# Patient Record
Sex: Female | Born: 1975 | Race: White | Hispanic: No | Marital: Married | Smoking: Never smoker
Health system: Southern US, Community
[De-identification: ages and names within clinical notes are randomized; demographics above are authoritative.]

## PROBLEM LIST (undated history)

## (undated) DIAGNOSIS — J069 Acute upper respiratory infection, unspecified: Secondary | ICD-10-CM

## (undated) DIAGNOSIS — Z98891 History of uterine scar from previous surgery: Secondary | ICD-10-CM

## (undated) DIAGNOSIS — E785 Hyperlipidemia, unspecified: Secondary | ICD-10-CM

## (undated) HISTORY — PX: TONSILLECTOMY: SUR1361

---

## 1999-11-09 ENCOUNTER — Other Ambulatory Visit: Admission: RE | Admit: 1999-11-09 | Discharge: 1999-11-09 | Payer: Self-pay | Admitting: Family Medicine

## 2002-10-07 ENCOUNTER — Encounter: Payer: Self-pay | Admitting: Gastroenterology

## 2002-10-07 ENCOUNTER — Encounter: Admission: RE | Admit: 2002-10-07 | Discharge: 2002-10-07 | Payer: Self-pay | Admitting: Gastroenterology

## 2003-08-01 ENCOUNTER — Other Ambulatory Visit: Admission: RE | Admit: 2003-08-01 | Discharge: 2003-08-01 | Payer: Self-pay | Admitting: Obstetrics and Gynecology

## 2004-11-16 ENCOUNTER — Other Ambulatory Visit: Admission: RE | Admit: 2004-11-16 | Discharge: 2004-11-16 | Payer: Self-pay | Admitting: Obstetrics and Gynecology

## 2005-12-13 ENCOUNTER — Other Ambulatory Visit: Admission: RE | Admit: 2005-12-13 | Discharge: 2005-12-13 | Payer: Self-pay | Admitting: Obstetrics and Gynecology

## 2006-04-13 ENCOUNTER — Inpatient Hospital Stay (HOSPITAL_COMMUNITY): Admission: AD | Admit: 2006-04-13 | Discharge: 2006-04-13 | Payer: Self-pay | Admitting: Obstetrics and Gynecology

## 2006-04-20 ENCOUNTER — Ambulatory Visit (HOSPITAL_COMMUNITY): Admission: RE | Admit: 2006-04-20 | Discharge: 2006-04-20 | Payer: Self-pay | Admitting: Obstetrics and Gynecology

## 2006-06-14 ENCOUNTER — Inpatient Hospital Stay (HOSPITAL_COMMUNITY): Admission: AD | Admit: 2006-06-14 | Discharge: 2006-06-14 | Payer: Self-pay | Admitting: Obstetrics and Gynecology

## 2006-07-10 ENCOUNTER — Inpatient Hospital Stay (HOSPITAL_COMMUNITY): Admission: AD | Admit: 2006-07-10 | Discharge: 2006-07-14 | Payer: Self-pay | Admitting: Obstetrics and Gynecology

## 2006-07-11 ENCOUNTER — Encounter (INDEPENDENT_AMBULATORY_CARE_PROVIDER_SITE_OTHER): Payer: Self-pay | Admitting: *Deleted

## 2006-07-15 ENCOUNTER — Encounter: Admission: RE | Admit: 2006-07-15 | Discharge: 2006-07-21 | Payer: Self-pay | Admitting: Obstetrics and Gynecology

## 2009-03-22 ENCOUNTER — Inpatient Hospital Stay (HOSPITAL_COMMUNITY): Admission: AD | Admit: 2009-03-22 | Discharge: 2009-03-25 | Payer: Self-pay | Admitting: Obstetrics and Gynecology

## 2010-02-03 ENCOUNTER — Ambulatory Visit (HOSPITAL_COMMUNITY): Admission: RE | Admit: 2010-02-03 | Discharge: 2010-02-03 | Payer: Self-pay | Admitting: Obstetrics and Gynecology

## 2010-07-18 ENCOUNTER — Encounter: Payer: Self-pay | Admitting: Obstetrics and Gynecology

## 2010-10-01 LAB — CBC
HCT: 30.7 % — ABNORMAL LOW (ref 36.0–46.0)
HCT: 36.6 % (ref 36.0–46.0)
Hemoglobin: 12.5 g/dL (ref 12.0–15.0)
MCV: 84.6 fL (ref 78.0–100.0)
Platelets: 254 10*3/uL (ref 150–400)
Platelets: 298 10*3/uL (ref 150–400)
RBC: 4.32 MIL/uL (ref 3.87–5.11)
RDW: 14.8 % (ref 11.5–15.5)
WBC: 17.8 10*3/uL — ABNORMAL HIGH (ref 4.0–10.5)
WBC: 20.5 10*3/uL — ABNORMAL HIGH (ref 4.0–10.5)

## 2010-10-01 LAB — COMPREHENSIVE METABOLIC PANEL
Albumin: 2.8 g/dL — ABNORMAL LOW (ref 3.5–5.2)
BUN: 5 mg/dL — ABNORMAL LOW (ref 6–23)
Chloride: 109 mEq/L (ref 96–112)
Creatinine, Ser: 0.39 mg/dL — ABNORMAL LOW (ref 0.4–1.2)
GFR calc non Af Amer: >60 mL/min (ref >60)
Total Bilirubin: 0.8 mg/dL (ref 0.3–1.2)

## 2010-10-01 LAB — RH IMMUNE GLOB WKUP(>/=20WKS)(NOT WOMEN'S HOSP)

## 2010-10-01 LAB — URIC ACID: Uric Acid, Serum: 3.7 mg/dL (ref 2.4–7.0)

## 2010-10-01 LAB — RPR: RPR Ser Ql: NONREACTIVE

## 2010-11-12 NOTE — Discharge Summary (Signed)
NAME:  Amy [name], Amy [name] NO.:  [id number]   MEDICAL RECORD NO.:  [id number]          PATIENT TYPE:  INP   LOCATION:  9119                          FACILITY:  [facility]   PHYSICIAN:  Zenaida Niece, M.D.DATE OF BIRTH:  [birth year]   DATE OF ADMISSION:  07/10/2006  DATE OF DISCHARGE:                               DISCHARGE SUMMARY   ADMISSION DIAGNOSES:  1. Intrauterine pregnancy at 40 weeks.  2. Group B streptococcus carrier.   DISCHARGE DIAGNOSES:  1. Intrauterine pregnancy at 40 weeks.  2. Group B streptococcus carrier.  3. Arrest of dilation.   PROCEDURES:  On July 11, 2006, she had a primary low transverse  cesarean section.   HISTORY AND PHYSICAL:  This is a 35 year old white female gravida 1,  para 0 with an EGA of [redacted] weeks who presents for induction due to a  favorable cervix.  Prenatal care complicated by sinusitis at 21 weeks  treated with Augmentin, and breech presentation at 35 weeks for which  she had a successful external cephalic version.  She has had occasional  elevated blood pressures in the office with normal labs.  Prenatal labs:  Blood type is O negative with a negative antibody screen, RPR  nonreactive, rubella immune, hepatitis B surface antigen negative, and  chlamydia negative, 1-hour Glucola 118, and group B strep is positive.  Past medical history:  Irritable bowel syndrome.  Past surgical history:  Tonsillectomy.  Physical exam:  She is afebrile with stable vital signs.  Fetal heart tracing reactive with contractions every 3 minutes on  Pitocin.  Abdomen gravid, nontender, with an estimated fetal weight of 8  pounds.  Cervix is 3, 70, -2, vertex presentation, adequate pelvis, and  amniotomy reveals clear fluid.   HOSPITAL COURSE:  The patient was admitted and started on Pitocin for  induction and penicillin for group B strep prophylaxis.  She then had  amniotomy also performed for augmentation.  She progressed to 9 cm but  was  unable to progress past that, and thus on the morning of January 15  underwent a primary low transverse cesarean section at 40 weeks for  arrest of dilation.  She delivered a viable female infant with Apgars of  9 and 9 that weighed 7 pounds 10 ounces.  Postoperatively, she had no  significant complications.  Predelivery hemoglobin 13.6, postdelivery  11.2.  She did have some leg swelling and some mild tachycardia, but  otherwise remained stable.  On postoperative day #3 her incision was  healing well and her staples were removed and Steri-Strips applied, and  she was felt to be stable enough for discharge home.   DISCHARGE INSTRUCTIONS:  Regular diet, pelvic rest, no strenuous  activity.  Followup is in 2 weeks for an incision check.  Medications  are Percocet #20 one-half to one p.o. q.4-6h. p.r.n. pain and over-the-  counter ibuprofen as needed, and she is given our discharge pamphlet.      Zenaida Niece, M.D.  Electronically Signed     TDM/MEDQ  D:  07/14/2006  T:  07/14/2006  Job:  [id number]

## 2010-11-12 NOTE — Op Note (Signed)
NAME:  Amy [name], VO NO.:  [id number]   MEDICAL RECORD NO.:  [id number]          PATIENT TYPE:  INP   LOCATION:  9119                          FACILITY:  [facility]   PHYSICIAN:  Zenaida Niece, M.D.DATE OF BIRTH:  04-Sep-1975   DATE OF PROCEDURE:  07/11/2006  DATE OF DISCHARGE:                               OPERATIVE REPORT   PREOPERATIVE DIAGNOSES:  1. Intrauterine pregnancy at 40+ weeks.  2. Arrest of dilation.   POSTOPERATIVE DIAGNOSES:  1. Intrauterine pregnancy at 40+ weeks.  2. Arrest of dilation.   PROCEDURE:  Primary low transverse cesarean section without extensions.   SURGEON:  Lavina Hamman, M.D.   ANESTHESIA:  Epidural.   SPECIMENS:  Placenta sent for cord blood collection and then to  Pathology.   ESTIMATED BLOOD LOSS:  800 mL.   COMPLICATIONS:  None.   FINDINGS:  The patient had normal anatomy and delivered a viable female  infant with Apgars of 9 and 9 that weighed 7 pounds 10 ounces.   PROCEDURE IN DETAIL:  The patient was taken to the operating room and  placed in the dorsal supine position with a left lateral tilt.  Her  previously placed epidural was dosed appropriately.  Abdomen was prepped  and draped in the usual sterile fashion.  The level of her anesthesia  was found to be adequate and abdomen was entered via a standard  Pfannenstiel's incision.  A disposable self-retaining retractor was  placed and a 4-cm transverse incision was made in the lower uterine  segment, pushing the bladder inferior.  Once the uterine cavity was  entered, the incision was extended bilaterally digitally.  The fetal  vertex was grasped and delivered through the incision atraumatically.  Mouth and nares were suctioned.  The remainder of the infant then  delivered atraumatically.  The cord was doubly clamped and cut and the  infant handed to the awaiting pediatric team.  Placenta delivered  spontaneously and was sent for cord blood collection and then  to  Pathology.  The uterus was wiped dry with a clean lap pad and all clots  and debris removed.  Uterine incision was inspected and found to be free  of extensions.  Uterine incision was then closed in 2 layers, with the  1st layer being a running locking layer with #1 chromic and the 2nd  layer being an imbricating layer also with #1 chromic.  Uterine incision  was inspected and found to be hemostatic.  Tubes and ovaries were  inspected and found to be normal.  The subfascial space was then  irrigated and made hemostatic with electrocautery.  Fascia was closed in  a running fashion starting at both ends and meeting in the middle with 0  Vicryl.  Subcutaneous tissue was irrigated and made hemostatic with  electrocautery.  Skin was closed with staples followed by a sterile  dressing.  The patient tolerated the procedure well and was taken to the  recovery room in stable condition.  Counts were correct x2 and she was  given Ancef 1 g IV after cord clamp.  Zenaida Niece, M.D.  Electronically Signed     TDM/MEDQ  D:  07/11/2006  T:  07/11/2006  Job:  [id number]

## 2011-06-16 LAB — OB RESULTS CONSOLE HEPATITIS B SURFACE ANTIGEN: Hepatitis B Surface Ag: NEGATIVE

## 2011-06-16 LAB — OB RESULTS CONSOLE ABO/RH: RH Type: NEGATIVE

## 2011-12-28 DIAGNOSIS — J069 Acute upper respiratory infection, unspecified: Secondary | ICD-10-CM

## 2011-12-28 HISTORY — DX: Acute upper respiratory infection, unspecified: J06.9

## 2011-12-31 ENCOUNTER — Encounter (HOSPITAL_COMMUNITY): Payer: Self-pay | Admitting: Pharmacist

## 2012-01-05 ENCOUNTER — Encounter (HOSPITAL_COMMUNITY): Admission: RE | Admit: 2012-01-05 | Payer: 59 | Source: Ambulatory Visit

## 2012-01-05 ENCOUNTER — Encounter (HOSPITAL_COMMUNITY): Payer: Self-pay

## 2012-01-05 HISTORY — DX: Hyperlipidemia, unspecified: E78.5

## 2012-01-05 HISTORY — DX: Acute upper respiratory infection, unspecified: J06.9

## 2012-01-05 LAB — CBC
MCH: 26.2 pg (ref 26.0–34.0)
MCHC: 32.4 g/dL (ref 30.0–36.0)
Platelets: 247 10*3/uL (ref 150–400)

## 2012-01-05 LAB — TYPE AND SCREEN: ABO/RH(D): O NEG

## 2012-01-05 LAB — SURGICAL PCR SCREEN: MRSA, PCR: NEGATIVE

## 2012-01-05 NOTE — Patient Instructions (Addendum)
   Your procedure is scheduled on:  Friday, July 19th  Enter through the [employer] of [facility] at: 8am Pick up the phone at the desk and dial [phone] and inform us of your arrival.  Please call this number if you have any problems the morning of surgery: [phone]  Remember: Do not eat food after midnight: Thursday Do not drink clear liquids after: Thursday Take these medicines the morning of surgery with a SIP OF WATER: none  Do not wear jewelry, make-up, or FINGER nail polish No metal in your hair or on your body. Do not wear lotions, powders, perfumes or deodorant. Do not shave 48 hours prior to surgery. Do not bring valuables to the hospital. Contacts, dentures or bridgework may not be worn into surgery.  Leave suitcase in the car. After Surgery it may be brought to your room. For patients being admitted to the hospital, checkout time is 11:00am the day of discharge. Home with husband Dalyla Chui  cell [phone].  Patients discharged on the day of surgery will not be allowed to drive home.     Remember to use your hibiclens as instructed.Please shower with 1/2 bottle the evening before your surgery and the other 1/2 bottle the morning of surgery. Neck down avoiding private area.

## 2012-01-06 LAB — RPR: RPR Ser Ql: NONREACTIVE

## 2012-01-12 NOTE — H&P (Signed)
Amy [name] is a 36 y.o. female G3P2002 at 5 weks (EDD 01/20/12 by LMP c/w 7 week [place]) presenting for scheduled repeat c-section after 2 prior c-sections for FTP.  Prenatal care otherwise uneventful.  Rh negative and received rhogam at 28 weeks.  History OB History    Grav Para Term Preterm Abortions TAB SAB Ect Mult Living   3 2 2       2     LTCS 2008 FTP 7#10oz LTCS 2010 6#10oz FTP/failed VBAC  Past Medical History  Diagnosis Date  . Hyperlipidemia     no meds  . URI (upper respiratory infection) 12/28/11    tx w/z-pack from 12/28/11 - 01/01/12   Past Surgical History  Procedure Date  . Cesarean section 2008, 2010    x 2  . Tonsillectomy    Family History: family history is not on file. Social History:  reports that she has never smoked. She has never used smokeless tobacco. She reports that she drinks alcohol. She reports that she does not use illicit drugs.   Prenatal Transfer Tool  Maternal Diabetes: No Genetic Screening: Declined Maternal Ultrasounds/Referrals: Normal Fetal Ultrasounds or other Referrals:  None Maternal Substance Abuse:  No Significant Maternal Medications:  None Significant Maternal Lab Results:  None Other Comments:  None  ROS    Last menstrual period 04/15/2011. Maternal Exam:  Uterine Assessment: Contraction strength is mild.  Contraction frequency is irregular.   Abdomen: Patient reports no abdominal tenderness. Fetal presentation: vertex  Introitus: Normal vulva. Normal vagina.    Physical Exam  Constitutional: She is oriented to person, place, and time. She appears well-developed and well-nourished.  Cardiovascular: Normal rate and regular rhythm.   Respiratory: Effort normal and breath sounds normal.  GI: Soft. Bowel sounds are normal.  Genitourinary: Vagina normal and uterus normal.  Musculoskeletal: Normal range of motion.  Neurological: She is alert and oriented to person, place, and time.  Psychiatric: She has a normal mood and  affect. Her behavior is normal.    Prenatal labs: ABO, Rh: --/--/O NEG (07/11 1122) Antibody: POS (07/11 1122) Rubella: Immune (12/20 0000) RPR: NON REACTIVE (07/11 1122)  HBsAg: Negative (12/20 0000)  HIV: Non-reactive (12/20 0000)  GBS:   Negative One hour GTT 108 Declined genetic screens   Assessment/Plan: Pt comes in for repeat c-section with h/o c-section x2 previously.  D/w pt risks of bleeding, infection and possible damage to bowel and bladder.  Pt understands and desires to proceed.  [name] 01/12/2012, 6:40 PM

## 2012-01-13 ENCOUNTER — Encounter (HOSPITAL_COMMUNITY): Payer: Self-pay

## 2012-01-13 ENCOUNTER — Inpatient Hospital Stay (HOSPITAL_COMMUNITY): Payer: 59

## 2012-01-13 ENCOUNTER — Encounter (HOSPITAL_COMMUNITY)
Admission: RE | Disposition: A | Discharge status: Home / Self Care | Payer: Self-pay | Source: Ambulatory Visit | Attending: Obstetrics and Gynecology

## 2012-01-13 ENCOUNTER — Encounter (HOSPITAL_COMMUNITY): Payer: Self-pay | Admitting: *Deleted

## 2012-01-13 ENCOUNTER — Inpatient Hospital Stay (HOSPITAL_COMMUNITY)
Admission: RE | Admit: 2012-01-13 | Discharge: 2012-01-15 | DRG: 766 | Disposition: A | Discharge status: Home / Self Care | Payer: 59 | Source: Ambulatory Visit | Attending: Obstetrics and Gynecology | Admitting: Obstetrics and Gynecology

## 2012-01-13 ENCOUNTER — Encounter (HOSPITAL_COMMUNITY): Payer: Self-pay | Admitting: Anesthesiology

## 2012-01-13 DIAGNOSIS — Z01818 Encounter for other preprocedural examination: Secondary | ICD-10-CM

## 2012-01-13 DIAGNOSIS — Z01812 Encounter for preprocedural laboratory examination: Secondary | ICD-10-CM

## 2012-01-13 DIAGNOSIS — O34219 Maternal care for unspecified type scar from previous cesarean delivery: Principal | ICD-10-CM | POA: Diagnosis present

## 2012-01-13 DIAGNOSIS — Z98891 History of uterine scar from previous surgery: Secondary | ICD-10-CM

## 2012-01-13 HISTORY — DX: History of uterine scar from previous surgery: Z98.891

## 2012-01-13 LAB — TYPE AND SCREEN: ABO/RH(D): O NEG

## 2012-01-13 SURGERY — Surgical Case
Anesthesia: Spinal | Wound class: Clean Contaminated

## 2012-01-13 MED ORDER — TETANUS-DIPHTH-ACELL PERTUSSIS 5-2.5-18.5 LF-MCG/0.5 IM SUSP: 0.5000 mL | Freq: Once | INTRAMUSCULAR | Status: DC

## 2012-01-13 MED ORDER — ONDANSETRON HCL 4 MG/2ML IJ SOLN: 4.0000 mg | Freq: Three times a day (TID) | INTRAMUSCULAR | Status: DC | PRN

## 2012-01-13 MED ORDER — SCOPOLAMINE 1 MG/3DAYS TD PT72
MEDICATED_PATCH | TRANSDERMAL | Status: AC
  Filled 2012-01-13: qty 1

## 2012-01-13 MED ORDER — NALBUPHINE SYRINGE 5 MG/0.5 ML
INJECTION | INTRAMUSCULAR | Status: AC
  Administered 2012-01-13: 5 mg via SUBCUTANEOUS
  Filled 2012-01-13: qty 1

## 2012-01-13 MED ORDER — CEFAZOLIN SODIUM-DEXTROSE 2-3 GM-% IV SOLR: 2.0000 g | INTRAVENOUS | Status: DC

## 2012-01-13 MED ORDER — FENTANYL CITRATE 0.05 MG/ML IJ SOLN
INTRAMUSCULAR | Status: DC | PRN
  Administered 2012-01-13: 25 ug via INTRATHECAL

## 2012-01-13 MED ORDER — IBUPROFEN 600 MG PO TABS
600.0000 mg | ORAL_TABLET | Freq: Four times a day (QID) | ORAL | Status: DC | PRN
  Filled 2012-01-13 (×5): qty 1

## 2012-01-13 MED ORDER — LACTATED RINGERS IV SOLN: INTRAVENOUS | Status: DC

## 2012-01-13 MED ORDER — WITCH HAZEL-GLYCERIN EX PADS: 1.0000 "application " | MEDICATED_PAD | CUTANEOUS | Status: DC | PRN

## 2012-01-13 MED ORDER — MENTHOL 3 MG MT LOZG: 1.0000 | LOZENGE | OROMUCOSAL | Status: DC | PRN

## 2012-01-13 MED ORDER — MORPHINE SULFATE 0.5 MG/ML IJ SOLN
INTRAMUSCULAR | Status: AC
  Filled 2012-01-13: qty 10

## 2012-01-13 MED ORDER — KETOROLAC TROMETHAMINE 30 MG/ML IJ SOLN: 30.0000 mg | Freq: Four times a day (QID) | INTRAMUSCULAR | Status: AC | PRN

## 2012-01-13 MED ORDER — NALOXONE HCL 0.4 MG/ML IJ SOLN: 0.4000 mg | INTRAMUSCULAR | Status: DC | PRN

## 2012-01-13 MED ORDER — ZOLPIDEM TARTRATE 5 MG PO TABS: 5.0000 mg | ORAL_TABLET | Freq: Every evening | ORAL | Status: DC | PRN

## 2012-01-13 MED ORDER — OXYTOCIN 40 UNITS IN LACTATED RINGERS INFUSION - SIMPLE MED: 62.5000 mL/h | INTRAVENOUS | Status: AC

## 2012-01-13 MED ORDER — SCOPOLAMINE 1 MG/3DAYS TD PT72
1.0000 | MEDICATED_PATCH | Freq: Once | TRANSDERMAL | Status: DC
  Filled 2012-01-13: qty 1

## 2012-01-13 MED ORDER — EPHEDRINE 5 MG/ML INJ
INTRAVENOUS | Status: AC
  Filled 2012-01-13: qty 10

## 2012-01-13 MED ORDER — ONDANSETRON HCL 4 MG PO TABS: 4.0000 mg | ORAL_TABLET | ORAL | Status: DC | PRN

## 2012-01-13 MED ORDER — OXYCODONE-ACETAMINOPHEN 5-325 MG PO TABS
1.0000 | ORAL_TABLET | ORAL | Status: DC | PRN
  Administered 2012-01-14: 0.5 via ORAL
  Administered 2012-01-14 – 2012-01-15 (×4): 1 via ORAL
  Filled 2012-01-13 (×5): qty 1

## 2012-01-13 MED ORDER — ONDANSETRON HCL 4 MG/2ML IJ SOLN
INTRAMUSCULAR | Status: DC | PRN
  Administered 2012-01-13: 4 mg via INTRAVENOUS

## 2012-01-13 MED ORDER — LANOLIN HYDROUS EX OINT: 1.0000 "application " | TOPICAL_OINTMENT | CUTANEOUS | Status: DC | PRN

## 2012-01-13 MED ORDER — SENNOSIDES-DOCUSATE SODIUM 8.6-50 MG PO TABS
2.0000 | ORAL_TABLET | Freq: Every day | ORAL | Status: DC
  Administered 2012-01-13 – 2012-01-14 (×2): 2 via ORAL

## 2012-01-13 MED ORDER — ONDANSETRON HCL 4 MG/2ML IJ SOLN: 4.0000 mg | INTRAMUSCULAR | Status: DC | PRN

## 2012-01-13 MED ORDER — NALBUPHINE HCL 10 MG/ML IJ SOLN
5.0000 mg | INTRAMUSCULAR | Status: DC | PRN
  Administered 2012-01-13: 5 mg via SUBCUTANEOUS
  Filled 2012-01-13: qty 1

## 2012-01-13 MED ORDER — SODIUM CHLORIDE 0.9 % IJ SOLN: 3.0000 mL | INTRAMUSCULAR | Status: DC | PRN

## 2012-01-13 MED ORDER — LORATADINE 10 MG PO TABS
10.0000 mg | ORAL_TABLET | Freq: Every day | ORAL | Status: DC
  Filled 2012-01-13 (×2): qty 1

## 2012-01-13 MED ORDER — EPHEDRINE SULFATE 50 MG/ML IJ SOLN
INTRAMUSCULAR | Status: DC | PRN
  Administered 2012-01-13: 20 mg via INTRAVENOUS

## 2012-01-13 MED ORDER — SIMETHICONE 80 MG PO CHEW: 80.0000 mg | CHEWABLE_TABLET | ORAL | Status: DC | PRN

## 2012-01-13 MED ORDER — LACTATED RINGERS IV SOLN
INTRAVENOUS | Status: DC | PRN
  Administered 2012-01-13 (×3): via INTRAVENOUS

## 2012-01-13 MED ORDER — PRENATAL MULTIVITAMIN CH
1.0000 | ORAL_TABLET | Freq: Every day | ORAL | Status: DC
  Administered 2012-01-13 – 2012-01-15 (×3): 1 via ORAL
  Filled 2012-01-13 (×3): qty 1

## 2012-01-13 MED ORDER — DIPHENHYDRAMINE HCL 25 MG PO CAPS: 25.0000 mg | ORAL_CAPSULE | Freq: Four times a day (QID) | ORAL | Status: DC | PRN

## 2012-01-13 MED ORDER — IBUPROFEN 600 MG PO TABS
600.0000 mg | ORAL_TABLET | Freq: Four times a day (QID) | ORAL | Status: DC
  Administered 2012-01-13 – 2012-01-15 (×7): 600 mg via ORAL
  Filled 2012-01-13 (×3): qty 1

## 2012-01-13 MED ORDER — HYDROMORPHONE HCL PF 1 MG/ML IJ SOLN: 0.2500 mg | INTRAMUSCULAR | Status: DC | PRN

## 2012-01-13 MED ORDER — SIMETHICONE 80 MG PO CHEW
80.0000 mg | CHEWABLE_TABLET | Freq: Three times a day (TID) | ORAL | Status: DC
  Administered 2012-01-13 – 2012-01-15 (×6): 80 mg via ORAL

## 2012-01-13 MED ORDER — DIPHENHYDRAMINE HCL 25 MG PO CAPS: 25.0000 mg | ORAL_CAPSULE | ORAL | Status: DC | PRN

## 2012-01-13 MED ORDER — DIPHENHYDRAMINE HCL 50 MG/ML IJ SOLN: 25.0000 mg | INTRAMUSCULAR | Status: DC | PRN

## 2012-01-13 MED ORDER — PHENYLEPHRINE HCL 10 MG/ML IJ SOLN
INTRAMUSCULAR | Status: DC | PRN
  Administered 2012-01-13: 120 ug via INTRAVENOUS
  Administered 2012-01-13 (×2): 80 ug via INTRAVENOUS
  Administered 2012-01-13: 40 ug via INTRAVENOUS

## 2012-01-13 MED ORDER — SODIUM CHLORIDE 0.9 % IV SOLN
1.0000 ug/kg/h | INTRAVENOUS | Status: DC | PRN
  Filled 2012-01-13: qty 2.5

## 2012-01-13 MED ORDER — MEPERIDINE HCL 25 MG/ML IJ SOLN: 6.2500 mg | INTRAMUSCULAR | Status: DC | PRN

## 2012-01-13 MED ORDER — FENTANYL CITRATE 0.05 MG/ML IJ SOLN
INTRAMUSCULAR | Status: AC
  Filled 2012-01-13: qty 2

## 2012-01-13 MED ORDER — KETOROLAC TROMETHAMINE 60 MG/2ML IM SOLN
INTRAMUSCULAR | Status: AC
  Filled 2012-01-13: qty 2

## 2012-01-13 MED ORDER — SCOPOLAMINE 1 MG/3DAYS TD PT72
1.0000 | MEDICATED_PATCH | Freq: Once | TRANSDERMAL | Status: DC
  Administered 2012-01-13: 1.5 mg via TRANSDERMAL

## 2012-01-13 MED ORDER — METOCLOPRAMIDE HCL 5 MG/ML IJ SOLN: 10.0000 mg | Freq: Three times a day (TID) | INTRAMUSCULAR | Status: DC | PRN

## 2012-01-13 MED ORDER — PHENYLEPHRINE 40 MCG/ML (10ML) SYRINGE FOR IV PUSH (FOR BLOOD PRESSURE SUPPORT)
PREFILLED_SYRINGE | INTRAVENOUS | Status: AC
  Filled 2012-01-13: qty 5

## 2012-01-13 MED ORDER — NALBUPHINE HCL 10 MG/ML IJ SOLN
5.0000 mg | INTRAMUSCULAR | Status: DC | PRN
  Filled 2012-01-13: qty 1

## 2012-01-13 MED ORDER — LACTATED RINGERS IV SOLN
INTRAVENOUS | Status: DC
  Administered 2012-01-13: 09:00:00 via INTRAVENOUS

## 2012-01-13 MED ORDER — DIBUCAINE 1 % RE OINT: 1.0000 "application " | TOPICAL_OINTMENT | RECTAL | Status: DC | PRN

## 2012-01-13 MED ORDER — KETOROLAC TROMETHAMINE 60 MG/2ML IM SOLN
60.0000 mg | Freq: Once | INTRAMUSCULAR | Status: AC | PRN
  Administered 2012-01-13: 60 mg via INTRAMUSCULAR

## 2012-01-13 MED ORDER — DIPHENHYDRAMINE HCL 50 MG/ML IJ SOLN: 12.5000 mg | INTRAMUSCULAR | Status: DC | PRN

## 2012-01-13 MED ORDER — CEFAZOLIN SODIUM-DEXTROSE 2-3 GM-% IV SOLR
INTRAVENOUS | Status: AC
  Filled 2012-01-13: qty 50

## 2012-01-13 MED ORDER — MORPHINE SULFATE (PF) 0.5 MG/ML IJ SOLN
INTRAMUSCULAR | Status: DC | PRN
  Administered 2012-01-13: 150 ug via INTRATHECAL

## 2012-01-13 MED ORDER — OXYTOCIN 10 UNIT/ML IJ SOLN
40.0000 [IU] | INTRAVENOUS | Status: DC | PRN
  Administered 2012-01-13: 40 [IU] via INTRAVENOUS

## 2012-01-13 MED ORDER — 0.9 % SODIUM CHLORIDE (POUR BTL) OPTIME
TOPICAL | Status: DC | PRN
  Administered 2012-01-13: 1000 mL

## 2012-01-13 MED ORDER — ONDANSETRON HCL 4 MG/2ML IJ SOLN
INTRAMUSCULAR | Status: AC
  Filled 2012-01-13: qty 2

## 2012-01-13 SURGICAL SUPPLY — 36 items
CHLORAPREP W/TINT 26ML (MISCELLANEOUS) ×2 IMPLANT
CLOTH BEACON ORANGE TIMEOUT ST (SAFETY) ×2 IMPLANT
CONTAINER PREFILL 10% NBF 15ML (MISCELLANEOUS) IMPLANT
DRSG COVADERM 4X10 (GAUZE/BANDAGES/DRESSINGS) ×1 IMPLANT
ELECT REM PT RETURN 9FT ADLT (ELECTROSURGICAL) ×2
ELECTRODE REM PT RTRN 9FT ADLT (ELECTROSURGICAL) ×1 IMPLANT
EXTRACTOR VACUUM KIWI (MISCELLANEOUS) IMPLANT
EXTRACTOR VACUUM M CUP 4 TUBE (SUCTIONS) IMPLANT
GLOVE BIO SURGEON STRL SZ 6.5 (GLOVE) ×4 IMPLANT
GLOVE BIO SURGEON STRL SZ8 (GLOVE) ×2 IMPLANT
GLOVE ECLIPSE 6.0 STRL STRAW (GLOVE) ×1 IMPLANT
GLOVE INDICATOR 6.5 STRL GRN (GLOVE) ×1 IMPLANT
GLOVE ORTHO TXT STRL SZ7.5 (GLOVE) ×1 IMPLANT
GOWN PREVENTION PLUS LG XLONG (DISPOSABLE) ×4 IMPLANT
GOWN SURG XXL (GOWNS) ×1 IMPLANT
KIT ABG SYR 3ML LUER SLIP (SYRINGE) IMPLANT
NDL HYPO 25X5/8 SAFETYGLIDE (NEEDLE) ×1 IMPLANT
NEEDLE HYPO 25X5/8 SAFETYGLIDE (NEEDLE) ×2 IMPLANT
NS IRRIG 1000ML POUR BTL (IV SOLUTION) ×2 IMPLANT
PACK C SECTION WH (CUSTOM PROCEDURE TRAY) ×2 IMPLANT
RTRCTR C-SECT PINK 25CM LRG (MISCELLANEOUS) ×2 IMPLANT
SLEEVE SCD COMPRESS KNEE MED (MISCELLANEOUS) IMPLANT
STAPLER VISISTAT 35W (STAPLE) IMPLANT
SUT CHROMIC 1 CTX 36 (SUTURE) ×4 IMPLANT
SUT PLAIN 0 NONE (SUTURE) IMPLANT
SUT PLAIN 2 0 (SUTURE) ×2
SUT PLAIN 2 0 XLH (SUTURE) IMPLANT
SUT PLAIN ABS 2-0 CT1 27XMFL (SUTURE) IMPLANT
SUT VIC AB 0 CT1 27 (SUTURE) ×4
SUT VIC AB 0 CT1 27XBRD ANBCTR (SUTURE) ×2 IMPLANT
SUT VIC AB 2-0 CT1 27 (SUTURE)
SUT VIC AB 2-0 CT1 TAPERPNT 27 (SUTURE) IMPLANT
SUT VIC AB 4-0 KS 27 (SUTURE) ×1 IMPLANT
TOWEL OR 17X24 6PK STRL BLUE (TOWEL DISPOSABLE) ×4 IMPLANT
TRAY FOLEY CATH 14FR (SET/KITS/TRAYS/PACK) ×2 IMPLANT
WATER STERILE IRR 1000ML POUR (IV SOLUTION) ×2 IMPLANT

## 2012-01-13 NOTE — Anesthesia Preprocedure Evaluation (Signed)

## 2012-01-13 NOTE — Transfer of Care (Signed)
Immediate Anesthesia Transfer of Care Note  Patient: Amy [name]  Procedure(s) Performed: Procedure(s) (LRB): CESAREAN SECTION (N/A)  Patient Location: PACU  Anesthesia Type: Spinal  Level of Consciousness: awake, alert  and oriented  Airway & Oxygen Therapy: Patient Spontanous Breathing  Post-op Assessment: Report given to PACU RN and Post -op Vital signs reviewed and stable  Post vital signs: Reviewed and stable  Complications:  none

## 2012-01-13 NOTE — Anesthesia Postprocedure Evaluation (Signed)
Anesthesia Post Note  Patient: Amy [name]  Procedure(s) Performed: Procedure(s) (LRB): CESAREAN SECTION (N/A)  Anesthesia type: Spinal  Patient location: PACU  Post pain: Pain level controlled  Post assessment: Post-op Vital signs reviewed  Last Vitals:  Filed Vitals:   01/13/12 1148  BP: 107/69  Pulse: 74  Temp: 36.4 C  Resp: 16    Post vital signs: Reviewed  Level of consciousness: awake  Complications: No apparent anesthesia complications

## 2012-01-13 NOTE — Progress Notes (Signed)
Patient ID: Amy [name], female   DOB: 23-Nov-1975, 36 y.o.   MRN: [id number] Per pt no changes in dictated H&P and brief exam WNL.  Ready to proceed.

## 2012-01-13 NOTE — Addendum Note (Signed)
Addendum  created 01/13/12 1924 by Suella Grove, CRNA   Modules edited:Notes Section

## 2012-01-13 NOTE — Anesthesia Procedure Notes (Signed)
Spinal  Additional Notes Spinal Dosage in OR  Bupivicaine ml       1.3 PFMS04   mcg        150 Fentanyl mcg            25    

## 2012-01-13 NOTE — Op Note (Signed)
Operative note  Preoperative diagnosis Term pregnancy at [redacted] weeks gestation Prior C-section x2 desires repeat  Postoperative diagnosis Same  Procedure Repeat low transverse cesarean section with 2 layer closure of uterus  Surgeon Dr. Huel Cote Dr. Tawanna Cooler Meisinger  Anesthesia Spinal  Fluids Estimated blood loss 800 cc Urine output 100 cc clear urine IV fluids 1800 cc LR  Findings There is a viable female infant in the vertex presentation. Apgars were 8 and 9 weight is pending at time of dictation. Normal uterus tubes and ovaries were noted.  Specimen Placenta was sent to L&D after cord blood donation  Procedure Patient was taken to the operating room after informed consent was obtained. Spinal anesthesia was obtained without difficulty by Dr. Cristela Blue. An appropriate time out was performed and the patient was then prepped and draped in the normal sterile fashion in the dorsal supine position with a leftward tilt. An Allis clamp test performed demonstrated adequate anesthesia. A Pfannenstiel skin incision was then made through pre-existing scar and carried down to the underlying layer of fascia by sharp dissection and Bovie cautery. The fascia was then nicked in the midline and the incision was extended laterally with Mayo scissors. The inferior aspect of the incision was grasped with Coker clamps elevated and dissected off the underlying rectus muscles. In a similar fashion the superior aspect was dissected off the rectus muscles. The rectus muscles were separated in the midline and the peritoneal cavity entered bluntly. Peritoneal incision was then extended both superiorly and inferiorly with careful attention to avoid both bowel bladder. The Alexis self-retaining wound retractor was then placed within the incision and the lower uterine segment exposed. The lower uterine segment was then incised in a transverse fashion and the cavity itself entered bluntly. Membranes were  ruptured with clear fluid noted. The vertex was then delivered atraumatically the nose and mouth bulb suctioned. The remainder of the infant's body delivered without difficulty the cord was clamped and cut and the infant handed to the waiting pediatricians. The placenta was then expressed spontaneously and handed off for cord blood donation. The uterus was cleared of all clots and debris with moist lap sponge. The uterine incision was then closed in 2 layers the first a running locked layer of 1-0 chromic and the second an imbricating layer of the same suture. The gutters were inspected and cleared of all clots and debris the tubes and ovaries inspected and found to be normal there was no active bleeding noted along the incision line therefore all instruments and sponges were removed from the abdomen. The rectus muscles and peritoneum were then reapproximated with several interrupted mattress sutures of 2-0 Vicryl. The fascia was then closed with 0 Vicryl in a running fashion. Subcutaneous tissue was reapproximated with 3-0 plain in a running fashion. The skin was closed with subcuticular stitch of 4-0 Vicryl on a Keith needle. Then reinforced with benzoin and Steri-Strips. Again all instrument and sponge counts were correct and the patient was taken to the recovery room in good condition. The baby was able to stay in the OR with mom and accompanied her to the recovery room.

## 2012-01-13 NOTE — Brief Op Note (Signed)
01/13/2012  10:39 AM  PATIENT:  Amy [name]  36 y.o. female  PRE-OPERATIVE DIAGNOSIS:  previous cesarean section x 2  POST-OPERATIVE DIAGNOSIS:  previous cesarean section x 2  PROCEDURE:  Procedure(s) (LRB): Repeat Low Transverse CESAREAN SECTION (N/A)  SURGEON:  Surgeon(s) and Role:    * [name], MD - Primary    * Lavina Hamman, MD - Assisting  ANESTHESIA:   spinal  EBL:  Total I/O In: 2000 [I.V.:2000] Out: 850 [Urine:100; Blood:750]  BLOOD ADMINISTERED:none  DRAINS: Urinary Catheter (Foley)   LOCAL MEDICATIONS USED:  NONE  SPECIMEN:  Placenta DISPOSITION OF SPECIMEN:  L&D  COUNTS:  YES  TOURNIQUET:  * No tourniquets in log *  DICTATION: .Dragon Dictation  PLAN OF CARE: Admit to inpatient   PATIENT DISPOSITION:  PACU - hemodynamically stable.

## 2012-01-13 NOTE — Anesthesia Postprocedure Evaluation (Signed)
  Anesthesia Post-op Note  Patient: Amy [name]  Procedure(s) Performed: Procedure(s) (LRB): CESAREAN SECTION (N/A)  Patient Location: Mother/Baby  Anesthesia Type: Spinal  Level of Consciousness: awake  Airway and Oxygen Therapy: Patient Spontanous Breathing  Post-op Pain: none  Post-op Assessment: Patient's Cardiovascular Status Stable and Respiratory Function Stable  Post-op Vital Signs: Reviewed and stable  Complications: No apparent anesthesia complications

## 2012-01-14 LAB — CBC
Hemoglobin: 10.7 g/dL — ABNORMAL LOW (ref 12.0–15.0)
MCHC: 32.1 g/dL (ref 30.0–36.0)

## 2012-01-14 MED ORDER — RHO D IMMUNE GLOBULIN 1500 UNIT/2ML IJ SOLN
300.0000 ug | Freq: Once | INTRAMUSCULAR | Status: AC
Start: 2012-01-14 — End: 2012-01-14
  Administered 2012-01-14: 300 ug via INTRAVENOUS
  Filled 2012-01-14: qty 2

## 2012-01-14 NOTE — Progress Notes (Signed)
Subjective: Postpartum Day 1: Cesarean Delivery Patient reports incisional pain and tolerating PO.  Some difficulty voiding this am, but goin gOK now.  Pain controlled, nl lochia  Objective: Vital signs in last 24 hours: Temp:  [97.5 F (36.4 C)-98.8 F (37.1 C)] 98.3 F (36.8 C) (07/20 0430) Pulse Rate:  [72-105] 96  (07/20 0430) Resp:  [16-20] 20  (07/20 0430) BP: (106-148)/(65-88) 112/65 mmHg (07/20 0430) SpO2:  [96 %-100 %] 98 % (07/20 0430) Weight:  [81.647 kg (180 lb)] 81.647 kg (180 lb) (07/19 1500)  Physical Exam:  General: alert and no distress Lochia: appropriate Uterine Fundus: firm Incision: healing well DVT Evaluation: No evidence of DVT seen on physical exam.   Basename 01/14/12 0555  HGB 10.7*  HCT 33.3*    Assessment/Plan: Status post Cesarean section. Doing well postoperatively.  Continue current care.  Circumcision today.    BOVARD,Japji Kok 01/14/2012, 10:22 AM

## 2012-01-15 ENCOUNTER — Encounter (HOSPITAL_COMMUNITY): Payer: Self-pay | Admitting: Obstetrics and Gynecology

## 2012-01-15 DIAGNOSIS — Z98891 History of uterine scar from previous surgery: Secondary | ICD-10-CM

## 2012-01-15 HISTORY — DX: History of uterine scar from previous surgery: Z98.891

## 2012-01-15 MED ORDER — OXYCODONE-ACETAMINOPHEN 5-325 MG PO TABS: 1.0000 | ORAL_TABLET | ORAL | Status: AC | PRN

## 2012-01-15 MED ORDER — IBUPROFEN 800 MG PO TABS: 800.0000 mg | ORAL_TABLET | Freq: Three times a day (TID) | ORAL | Status: AC | PRN

## 2012-01-15 MED ORDER — PRENATAL MULTIVITAMIN CH: 1.0000 | ORAL_TABLET | Freq: Every day | ORAL | Status: DC

## 2012-01-15 NOTE — Progress Notes (Signed)
Subjective: Postpartum Day 2: Cesarean Delivery Patient reports incisional pain, tolerating PO and no problems voiding.  Very itchy at incision.    Objective: Vital signs in last 24 hours: Temp:  [97.5 F (36.4 C)-98.4 F (36.9 C)] 97.5 F (36.4 C) (07/21 0610) Pulse Rate:  [84-111] 84  (07/21 0610) Resp:  [18-20] 18  (07/21 0610) BP: (105-129)/(71-83) 105/71 mmHg (07/21 0610) SpO2:  [100 %] 100 % (07/20 2133)  Physical Exam:  General: alert and no distress Lochia: appropriate Uterine Fundus: firm Incision: healing well, blisters,small noted, pink also, NT DVT Evaluation: No evidence of DVT seen on physical exam.   Basename 01/14/12 0555  HGB 10.7*  HCT 33.3*    Assessment/Plan: Status post Cesarean section. Doing well postoperatively.  Discharge home with standard precautions and return to clinic in 2 weeks.  D/C with motrin/percocet/ PNV, to monitor incision benadryl or zyrtec prn.  Call if worsens  BOVARD,Natalyah Cummiskey 01/15/2012, 9:23 AM

## 2012-01-15 NOTE — Discharge Summary (Signed)
Obstetric Discharge Summary Reason for Admission: cesarean section Prenatal Procedures: none Intrapartum Procedures: cesarean: low cervical, transverse Postpartum Procedures: none Complications-Operative and Postpartum: none Hemoglobin  Date Value Range Status  01/14/2012 10.7* 12.0 - 15.0 g/dL Final     HCT  Date Value Range Status  01/14/2012 33.3* 36.0 - 46.0 % Final    Physical Exam:  General: alert and no distress Lochia: appropriate Uterine Fundus: firm Incision: healing well, some blisters DVT Evaluation: No evidence of DVT seen on physical exam.  Discharge Diagnoses: Term Pregnancy-delivered  Discharge Information: Date: 01/15/2012 Activity: pelvic rest Diet: routine Medications: PNV, Ibuprofen and Percocet Condition: stable Instructions: refer to practice specific booklet Discharge to: home Follow-up Information    Follow up with Amy Pila, MD. Schedule an appointment as soon as possible for a visit in 2 weeks.   Contact information:   [address] [place] [address] [phone]          Newborn Data: Live born female  Birth Weight: 7 lb 14.8 oz (3595 g) APGAR: 9, 9  Home with mother.  Amy [name],Amy [name] 01/15/2012, 9:34 AM

## 2012-01-16 ENCOUNTER — Encounter (HOSPITAL_COMMUNITY): Payer: Self-pay | Admitting: Obstetrics and Gynecology

## 2012-01-20 LAB — RH IG WORKUP (INCLUDES ABO/RH)
ABO/RH(D): O NEG
Gestational Age(Wks): 39

## 2013-06-14 ENCOUNTER — Encounter: Payer: Self-pay | Admitting: General Practice

## 2013-06-14 ENCOUNTER — Ambulatory Visit (INDEPENDENT_AMBULATORY_CARE_PROVIDER_SITE_OTHER): Payer: 59 | Admitting: General Practice

## 2013-06-14 VITALS — BP 129/88 | HR 90 | Temp 98.2°F | Ht 62.0 in | Wt 148.0 lb

## 2013-06-14 DIAGNOSIS — R42 Dizziness and giddiness: Secondary | ICD-10-CM

## 2013-06-14 DIAGNOSIS — R209 Unspecified disturbances of skin sensation: Secondary | ICD-10-CM

## 2013-06-14 DIAGNOSIS — R200 Anesthesia of skin: Secondary | ICD-10-CM

## 2013-06-14 LAB — POCT CBC
Lymph, poc: 2.6 (ref 0.6–3.4)
MCH, POC: 24.3 pg — AB (ref 27–31.2)
MCHC: 31.2 g/dL — AB (ref 31.8–35.4)
MPV: 6.7 fL (ref 0–99.8)
Platelet Count, POC: 328 10*3/uL (ref 142–424)
RDW, POC: 14 %
WBC: 8.4 10*3/uL (ref 4.6–10.2)

## 2013-06-14 NOTE — Progress Notes (Signed)
   Subjective:    Patient ID: Amy [name], female    DOB: 1975/12/06, 37 y.o.   MRN: [id number]  HPI Patient presents today with complaints of a "tingling and numbness" sensation in right upper arm. Reports onset as 1 week ago and the sensation has gradually moved down to fingers. She reports it worsened today while holding a child in her arm, which is when the sensation moved to her fingers. Taking ibuprofen otc as directed with no relief. Denies chest pain or pressure.     Review of Systems  Constitutional: Negative for fever, chills, diaphoresis and fatigue.  Respiratory: Negative for cough, chest tightness and wheezing.   Cardiovascular: Negative for chest pain, palpitations and leg swelling.  Gastrointestinal: Negative for nausea, vomiting, abdominal pain, diarrhea and blood in stool.  Genitourinary: Negative for dysuria and difficulty urinating.  Neurological: Positive for dizziness, light-headedness and numbness. Negative for weakness.       Objective:   Physical Exam  Constitutional: She is oriented to person, place, and time. She appears well-developed and well-nourished.  HENT:  Head: Normocephalic and atraumatic.  Right Ear: External ear normal.  Left Ear: External ear normal.  Eyes: EOM are normal. Pupils are equal, round, and reactive to light.  Neck: Normal range of motion. Neck supple. No thyromegaly present.  Cardiovascular: Normal rate, regular rhythm and normal heart sounds.   Pulmonary/Chest: Effort normal and breath sounds normal. No respiratory distress. She exhibits no tenderness.  Abdominal: Soft. Bowel sounds are normal. She exhibits no distension. There is no tenderness.  Lymphadenopathy:    She has no cervical adenopathy.  Neurological: She is alert and oriented to person, place, and time. She displays normal reflexes. No cranial nerve deficit. She exhibits normal muscle tone. Coordination normal.  Skin: Skin is warm and dry.  Psychiatric: She has a  normal mood and affect.          Assessment & Plan:  1. Numbness and tingling of right arm  - POCT CBC - CMP14+EGFR - EKG 12-Lead - Vitamin B12 - Thyroid Panel With TSH  2. Dizziness  - POCT CBC - CMP14+EGFR - Thyroid Panel With TSH -labs pending -seek emergency medical treatment if symptoms worsen -will refer for further evaluation if no improvement (ortho or neuro) -Patient verbalized understanding Coralie Keens, FNP-C

## 2013-06-14 NOTE — Patient Instructions (Signed)
Paresthesia °Paresthesia is an abnormal burning or prickling sensation. This sensation is generally felt in the hands, arms, legs, or feet. However, it may occur in any part of the body. It is usually not painful. The feeling may be described as: °· Tingling or numbness. °· "Pins and needles." °· Skin crawling. °· Buzzing. °· Limbs "falling asleep." °· Itching. °Most people experience temporary (transient) paresthesia at some time in their lives. °CAUSES  °Paresthesia may occur when you breathe too quickly (hyperventilation). It can also occur without any apparent cause. Commonly, paresthesia occurs when pressure is placed on a nerve. The feeling quickly goes away once the pressure is removed. For some people, however, paresthesia is a long-lasting (chronic) condition caused by an underlying disorder. The underlying disorder may be: °· A traumatic, direct injury to nerves. Examples include a: °· Broken (fractured) neck. °· Fractured skull. °· A disorder affecting the brain and spinal cord (central nervous system). Examples include: °· Transverse myelitis. °· Encephalitis. °· Transient ischemic attack. °· Multiple sclerosis. °· Stroke. °· Tumor or blood vessel problems, such as an arteriovenous malformation pressing against the brain or spinal cord. °· A condition that damages the peripheral nerves (peripheral neuropathy). Peripheral nerves are not part of the brain and spinal cord. These conditions include: °· Diabetes. °· Peripheral vascular disease. °· Nerve entrapment syndromes, such as carpal tunnel syndrome. °· Shingles. °· Hypothyroidism. °· Vitamin B12 deficiencies. °· Alcoholism. °· Heavy metal poisoning (lead, arsenic). °· Rheumatoid arthritis. °· Systemic lupus erythematosus. °DIAGNOSIS  °Your caregiver will attempt to find the underlying cause of your paresthesia. Your caregiver may: °· Take your medical history. °· Perform a physical exam. °· Order various lab tests. °· Order imaging tests. °TREATMENT    °Treatment for paresthesia depends on the underlying cause. °HOME CARE INSTRUCTIONS °· Avoid drinking alcohol. °· You may consider massage or acupuncture to help relieve your symptoms. °· Keep all follow-up appointments as directed by your caregiver. °SEEK IMMEDIATE MEDICAL CARE IF:  °· You feel weak. °· You have trouble walking or moving. °· You have problems with speech or vision. °· You feel confused. °· You cannot control your bladder or bowel movements. °· You feel numbness after an injury. °· You faint. °· Your burning or prickling feeling gets worse when walking. °· You have pain, cramps, or dizziness. °· You develop a rash. °MAKE SURE YOU: °· Understand these instructions. °· Will watch your condition. °· Will get help right away if you are not doing well or get worse. °Document Released: 06/03/2002 Document Revised: 09/05/2011 Document Reviewed: 03/04/2011 °ExitCare® Patient Information ©2014 [employer]. ° °

## 2013-06-15 LAB — CMP14+EGFR
ALT: 14 IU/L (ref 0–32)
AST: 15 IU/L (ref 0–40)
BUN: 12 mg/dL (ref 6–20)
CO2: 19 mmol/L (ref 18–29)
Calcium: 9.1 mg/dL (ref 8.7–10.2)
Chloride: 107 mmol/L (ref 97–108)
Glucose: 76 mg/dL (ref 65–99)
Potassium: 3.4 mmol/L — ABNORMAL LOW (ref 3.5–5.2)
Total Bilirubin: <0.2 mg/dL (ref 0.0–1.2)
Total Protein: 6.4 g/dL (ref 6.0–8.5)

## 2013-06-15 LAB — VITAMIN B12: Vitamin B-12: 582 pg/mL (ref 211–946)

## 2013-06-15 LAB — THYROID PANEL WITH TSH
T3 Uptake Ratio: 28 % (ref 24–39)
T4, Total: 6.5 ug/dL (ref 4.5–12.0)
TSH: 1.95 u[IU]/mL (ref 0.450–4.500)

## 2013-06-27 ENCOUNTER — Other Ambulatory Visit: Payer: Self-pay | Admitting: General Practice

## 2013-06-27 DIAGNOSIS — E876 Hypokalemia: Secondary | ICD-10-CM

## 2013-07-09 ENCOUNTER — Telehealth: Payer: Self-pay | Admitting: *Deleted

## 2014-04-28 ENCOUNTER — Encounter: Payer: Self-pay | Admitting: General Practice

## 2015-12-01 ENCOUNTER — Ambulatory Visit (INDEPENDENT_AMBULATORY_CARE_PROVIDER_SITE_OTHER): Payer: 59 | Admitting: Family Medicine

## 2015-12-01 ENCOUNTER — Encounter: Payer: Self-pay | Admitting: Family Medicine

## 2015-12-01 VITALS — BP 106/73 | HR 88 | Temp 98.2°F | Ht 62.0 in | Wt 138.8 lb

## 2015-12-01 DIAGNOSIS — M6248 Contracture of muscle, other site: Secondary | ICD-10-CM | POA: Diagnosis not present

## 2015-12-01 DIAGNOSIS — M62838 Other muscle spasm: Secondary | ICD-10-CM

## 2015-12-01 MED ORDER — CYCLOBENZAPRINE HCL 10 MG PO TABS: 10.0000 mg | ORAL_TABLET | Freq: Three times a day (TID) | ORAL | Status: DC | PRN

## 2015-12-01 MED ORDER — PREDNISONE 50 MG PO TABS: 50.0000 mg | ORAL_TABLET | Freq: Every day | ORAL | Status: DC

## 2015-12-01 NOTE — Patient Instructions (Signed)
Great to see you!  Start prednisone early in the day, it may disturb your sleep.   Flexeril will likely make you sleepy, try to take it every 8 hours for the next 2 days. You may need to break it in half depending on how it affects you  Muscle Cramps and Spasms Muscle cramps and spasms are when muscles tighten by themselves. They usually get better within minutes. Muscle cramps are painful. They are usually stronger and last longer than muscle spasms. Muscle spasms may or may not be painful. They can last a few seconds or much longer. HOME CARE  Drink enough fluid to keep your pee (urine) clear or pale yellow.  Massage, stretch, and relax the muscle.  Use a warm towel, heating pad, or warm shower water on tight muscles.  Place ice on the muscle if it is tender or in pain.  Put ice in a plastic bag.  Place a towel between your skin and the bag.  Leave the ice on for 15-20 minutes, 03-04 times a day.  Only take medicine as told by your doctor. GET HELP RIGHT AWAY IF:  Your cramps or spasms get worse, happen more often, or do not get better with time. MAKE SURE YOU:  Understand these instructions.  Will watch your condition.  Will get help right away if you are not doing well or get worse.   This information is not intended to replace advice given to you by your health care provider. Make sure you discuss any questions you have with your health care provider.   Document Released: 05/26/2008 Document Revised: 10/08/2012 Document Reviewed: 05/30/2012 Elsevier Interactive Patient Education [employer]

## 2015-12-01 NOTE — Progress Notes (Signed)
   HPI  Patient presents today with neck pain.  She is a family friend of mine.  Patient explains her last one week she's had intermittent neck pain. 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 She had neck discomfort and she thought that her pillow may be the culprit.  Sunday, 2 days ago she developed severe left-sided neck pain which has not stopped since that time It worsens last night causing her to cry. She tried tizanidine without any improvement. She's tried 600 mg of Motrin which helps but does not completely resolve the pain.  She denies any fever, chills, sweats, sore throat.  PMH: Smoking status noted ROS: Per HPI  Objective: BP 106/73 mmHg  Pulse 88  Temp(Src) 98.2 F (36.8 C) (Oral)  Ht 5\' 2"  (1.575 m)  Wt 138 lb 12.8 oz (62.959 kg)  BMI 25.38 kg/m2 Gen: NAD, alert, cooperative with exam HEENT: NCAT CV: RRR, good S1/S2, no murmur Resp: CTABL, no wheezes, non-labored Ext: No edema, warm Neuro: Alert and oriented, No gross deficits  MSK No tenderness to palpation of cervical spine or paraspinal muscles She does have tenderness of the left side mastoid area and upper left neck with turning her head to the left or to the right against force She has limited range of motion with upper gaze, lower gaze, and left or right, approximately 30-40 in each direction  Assessment and plan:  # Neck muscle spasm No signs of meningitis or serious etiology Most likely musculoskeletal pain Prednisone 50 mg 5 days Scheduled Flexeril 2 days then when necessary Ice Return to clinic with any concerns or questions    Meds ordered this encounter  Medications  . predniSONE (DELTASONE) 50 MG tablet    Sig: Take 1 tablet (50 mg total) by mouth daily with breakfast.    Dispense:  5 tablet    Refill:  0  . cyclobenzaprine (FLEXERIL) 10 MG tablet    Sig: Take 1 tablet (10 mg total) by mouth 3 (three) times daily as needed for muscle spasms.    Dispense:  30 tablet     Refill:  0    Murtis SinkSam Eustolia Drennen, MD [facility] 12/01/2015, 9:53 AM

## 2016-02-17 ENCOUNTER — Telehealth: Payer: Self-pay | Admitting: Family Medicine

## 2016-02-17 MED ORDER — TRIAMCINOLONE ACETONIDE 0.5 % EX OINT: 1.0000 "application " | TOPICAL_OINTMENT | Freq: Two times a day (BID) | CUTANEOUS | 0 refills | Status: AC

## 2016-02-17 MED ORDER — CLOBETASOL PROPIONATE 0.05 % EX OINT: 1.0000 "application " | TOPICAL_OINTMENT | Freq: Two times a day (BID) | CUTANEOUS | 0 refills | Status: DC

## 2016-02-17 NOTE — Telephone Encounter (Signed)
Patient has an outbreak of recurrent contact dermatitis not responding to triamcinolone.   Will increase potency, cobetasol. Reviewed signs of infection and using ointment for only 7-10 days.   Murtis SinkSam Slate Debroux, MD [facility] 02/17/2016, 1:22 PM

## 2016-02-18 ENCOUNTER — Ambulatory Visit: Payer: 59 | Admitting: Family Medicine

## 2016-10-19 ENCOUNTER — Other Ambulatory Visit: Payer: Self-pay | Admitting: Obstetrics and Gynecology

## 2016-10-19 DIAGNOSIS — R928 Other abnormal and inconclusive findings on diagnostic imaging of breast: Secondary | ICD-10-CM

## 2016-10-20 ENCOUNTER — Ambulatory Visit
Admission: RE | Admit: 2016-10-20 | Discharge: 2016-10-20 | Disposition: A | Discharge status: Home / Self Care | Payer: 59 | Source: Ambulatory Visit | Attending: Obstetrics and Gynecology | Admitting: Obstetrics and Gynecology

## 2016-10-20 ENCOUNTER — Other Ambulatory Visit: Payer: Self-pay | Admitting: Obstetrics and Gynecology

## 2016-10-20 DIAGNOSIS — R928 Other abnormal and inconclusive findings on diagnostic imaging of breast: Secondary | ICD-10-CM

## 2016-10-25 ENCOUNTER — Other Ambulatory Visit: Payer: Self-pay

## 2017-03-13 ENCOUNTER — Other Ambulatory Visit: Payer: Self-pay | Admitting: Obstetrics and Gynecology

## 2017-03-13 DIAGNOSIS — N6489 Other specified disorders of breast: Secondary | ICD-10-CM

## 2017-04-27 ENCOUNTER — Ambulatory Visit
Admission: RE | Admit: 2017-04-27 | Discharge: 2017-04-27 | Disposition: A | Discharge status: Home / Self Care | Payer: 59 | Source: Ambulatory Visit | Attending: Obstetrics and Gynecology | Admitting: Obstetrics and Gynecology

## 2017-04-27 ENCOUNTER — Ambulatory Visit: Payer: 59

## 2017-04-27 DIAGNOSIS — N6489 Other specified disorders of breast: Secondary | ICD-10-CM

## 2017-11-21 ENCOUNTER — Other Ambulatory Visit: Payer: Self-pay | Admitting: Obstetrics and Gynecology

## 2017-11-21 DIAGNOSIS — R928 Other abnormal and inconclusive findings on diagnostic imaging of breast: Secondary | ICD-10-CM

## 2017-12-04 ENCOUNTER — Ambulatory Visit
Admission: RE | Admit: 2017-12-04 | Discharge: 2017-12-04 | Disposition: A | Discharge status: Home / Self Care | Payer: 59 | Source: Ambulatory Visit | Attending: Obstetrics and Gynecology | Admitting: Obstetrics and Gynecology

## 2017-12-04 DIAGNOSIS — R928 Other abnormal and inconclusive findings on diagnostic imaging of breast: Secondary | ICD-10-CM

## 2018-06-25 ENCOUNTER — Other Ambulatory Visit: Payer: Self-pay | Admitting: Obstetrics and Gynecology

## 2018-06-25 DIAGNOSIS — N6489 Other specified disorders of breast: Secondary | ICD-10-CM

## 2018-07-03 ENCOUNTER — Other Ambulatory Visit: Payer: 59

## 2018-07-17 ENCOUNTER — Ambulatory Visit: Admission: RE | Admit: 2018-07-17 | Payer: 59 | Source: Ambulatory Visit

## 2018-07-17 ENCOUNTER — Other Ambulatory Visit: Payer: Self-pay | Admitting: Obstetrics and Gynecology

## 2018-07-17 ENCOUNTER — Ambulatory Visit
Admission: RE | Admit: 2018-07-17 | Discharge: 2018-07-17 | Disposition: A | Discharge status: Home / Self Care | Payer: 59 | Source: Ambulatory Visit | Attending: Obstetrics and Gynecology | Admitting: Obstetrics and Gynecology

## 2018-07-17 DIAGNOSIS — N6489 Other specified disorders of breast: Secondary | ICD-10-CM

## 2019-01-16 ENCOUNTER — Other Ambulatory Visit: Payer: 59

## 2019-01-22 ENCOUNTER — Ambulatory Visit
Admission: RE | Admit: 2019-01-22 | Discharge: 2019-01-22 | Disposition: A | Discharge status: Home / Self Care | Payer: 59 | Source: Ambulatory Visit | Attending: Obstetrics and Gynecology | Admitting: Obstetrics and Gynecology

## 2019-01-22 ENCOUNTER — Ambulatory Visit: Admission: RE | Admit: 2019-01-22 | Payer: 59 | Source: Ambulatory Visit

## 2019-01-22 ENCOUNTER — Other Ambulatory Visit: Payer: Self-pay

## 2019-01-22 DIAGNOSIS — N6489 Other specified disorders of breast: Secondary | ICD-10-CM

## 2019-08-22 ENCOUNTER — Other Ambulatory Visit: Payer: Self-pay

## 2019-08-22 ENCOUNTER — Ambulatory Visit: Payer: 59 | Admitting: Cardiology

## 2019-08-22 ENCOUNTER — Encounter: Payer: Self-pay | Admitting: Cardiology

## 2019-08-22 VITALS — BP 148/92 | HR 83 | Temp 97.9°F | Resp 16 | Ht 62.0 in | Wt 142.9 lb

## 2019-08-22 DIAGNOSIS — E7800 Pure hypercholesterolemia, unspecified: Secondary | ICD-10-CM

## 2019-08-22 DIAGNOSIS — R000 Tachycardia, unspecified: Secondary | ICD-10-CM

## 2019-08-22 DIAGNOSIS — R0789 Other chest pain: Secondary | ICD-10-CM

## 2019-08-22 DIAGNOSIS — R030 Elevated blood-pressure reading, without diagnosis of hypertension: Secondary | ICD-10-CM

## 2019-08-22 NOTE — Progress Notes (Signed)
Primary Physician/Referring:  Chesley Noon, MD  Patient ID: Amy [name], female    DOB: [birth year], 44 y.o.   MRN: [id number]  Chief Complaint  Patient presents with  . Shortness of Breath  . New Patient (Initial Visit)    Chest discomfort  . Chest Pain   HPI:    Amy [name]  is a 44 y.o. patient was evaluated by different cardiology group for episodes of palpitation, underwent stress echocardiogram and also event monitor for 2 weeks.  She was also recently diagnosed with hyperlipidemia and started on statin therapy.  She has had episodes of palpitations, occasional episodes of shortness of breath in which she feels like she needs to take a deep breath and at times she has had chest tightness, not exertional.  As she has been very concerned about her symptoms, she requested a second opinion and has been referred to me by Dr. Veronia Beets.  Patient states that for the past 2 weeks she has started to feel better and has not had any significant palpitations or chest pain.  She is also tolerating statins well and is wondering whether she should do anything different to reduce her cardiovascular risk.  She does not smoke, there is no family history of premature coronary artery disease, no history of hypertension or diabetes.   Past Medical History:  Diagnosis Date  . Hyperlipidemia    no meds  . S/P cesarean section 01/15/2012  . URI (upper respiratory infection) 12/28/11   tx w/z-pack from 12/28/11 - 01/01/12   Past Surgical History:  Procedure Laterality Date  . CESAREAN SECTION  2008, 2010   x 2  . CESAREAN SECTION  01/13/2012   Procedure: CESAREAN SECTION;  Surgeon: Logan Bores, MD;  Location: [facility];  Service: Gynecology;  Laterality: N/A;  repeat   . TONSILLECTOMY     Family History  Problem Relation Age of Onset  . Cancer Mother        breast  . Hypertension Mother   . Hyperlipidemia Mother   . Breast cancer Mother 17  . Heart attack Father   .  Hyperlipidemia Maternal Grandmother   . Hyperlipidemia Maternal Grandfather     Social History   Tobacco Use  . Smoking status: Never Smoker  . Smokeless tobacco: Never Used  Substance Use Topics  . Alcohol use: No    Comment: socially but none with pregnancy   ROS  Review of Systems  Cardiovascular: Negative for chest pain, dyspnea on exertion and leg swelling.  Gastrointestinal: Negative for melena.   Objective  Blood pressure (!) 148/92, pulse 83, temperature 97.9 F (36.6 C), temperature source Temporal, resp. rate 16, height 5' 2" (1.575 m), weight 142 lb 14.4 oz (64.8 kg), SpO2 99 %.  Vitals with BMI 08/22/2019 12/01/2015 06/14/2013  Height 5' 2" 5' 2" 5' 2"  Weight 142 lbs 14 oz 138 lbs 13 oz 148 lbs  BMI 26.13 20.2 54.2  Systolic 706 237 628  Diastolic 92 73 88  Pulse 83 88 90     Physical Exam  Cardiovascular: Normal rate, regular rhythm, normal heart sounds and intact distal pulses. Exam reveals no gallop.  No murmur heard. No leg edema, no JVD.  Pulmonary/Chest: Effort normal and breath sounds normal.  Abdominal: Soft. Bowel sounds are normal.   Laboratory examination:   External labs  Comprehensive Metabolic PanelResulted: 09/09/1759 1:35 AM [facility] Component Name Value Ref Range Glucose 81 65 - 99 mg/dL BUN 10  6 - 24 mg/dL Creatinine, Serum 0.63 0.57 - 1 mg/dL eGFR If NonAfrican American 112 >59 mL/min/1.73 eGFR If African American 129 >59 mL/min/1.73 BUN/Creatinine Ratio 16 9 - 23  Sodium 140 134 - 144 mmol/L Potassium 5.0 3.5 - 5.2 mmol/L Chloride 105 96 - 106 mmol/L CO2 23 20 - 29 mmol/L CALCIUM 9.8 8.7 - 10.2 mg/dL Total Protein 6.8 6 - 8.5 g/dL Albumin, Serum 4.6 3.8 - 4.8 g/dL Globulin, Total 2.2 1.5 - 4.5 g/dL Albumin/Globulin Ratio 2.1 1.2 - 2.2  Total Bilirubin 0.4 0 - 1.2 mg/dL Alkaline Phosphatase 61 39 - 117 IU/L  Lipid Panel With LDL/HDL RatioResulted: 05/31/2019 7:36 AM [facility] Component Name Value Ref Range  Cholesterol,  Total 233 (H) 100 - 199 mg/dL  Triglycerides 69 0 - 149 mg/dL  HDL 51 >39 mg/dL  VLDL Cholesterol Cal 12 5 - 40 mg/dL  LDL 170 (H) 0 - 99 mg/dL   LP+Non-HDL CholesterolResulted: 07/19/2019 1:35 AM [facility] Component Name Value Ref Range Cholesterol, Total 163 100 - 199 mg/dL Triglycerides 71 0 - 149 mg/dL HDL 57 >39 mg/dL VLDL Cholesterol Cal 14 5 - 40 mg/dL LDL 92 0 - 99 mg/dL  CBC And DifferentialResulted: 04/19/2019 3:35 AM [facility] Component Name Value Ref Range WBC 6.7 3.4 - 10.8 x10E3/uL RBC 5.01 3.77 - 5.28 x10E6/uL Hemoglobin 12.8 11.1 - 15.9 g/dL Hematocrit 39.9 34 - 46.6 % MCV 80 79 - 97 fL MCH 25.5 (L) 26.6 - 33 pg MCHC 32.1 31.5 - 35.7 g/dL RDW 13.8 11.7 - 15.4 % Platelet Count 352 150 - 450 x10E3/uL  TSH Reflex T4Free/T3 HormoneResulted: 04/19/2019 3:35 AM [facility] Component Name Value Ref Range TSH 1.170 0.45 - 4.5 uIU/mL  Medications and allergies  No Known Allergies   Current Outpatient Medications  Medication Instructions  . rizatriptan (MAXALT) 10 MG tablet No dose, route, or frequency recorded.  . rosuvastatin (CRESTOR) 10 mg, Oral, Daily at bedtime  . triamcinolone ointment (KENALOG) 0.5 % 1 application, Topical, 2 times daily    Radiology:   XR Chest Pa And Lateral (07/30/2019 12:44 PM EST)  IMPRESSION: No active disease.    FINDINGS: Normal heart size. The lungs and pleural spaces are clear. Mild scoliosis.  Cardiac Studies:   Event monitor 15 days 02-Jun-2019:  Predominant rhythm is normal sinus rhythm.are PACs and PVCs.    Stress echocardiogram 04/30/2019: Stress: Total exercise time was 8 minutes and 1 seconds. The patient   achieved 97% of the maximal age-predicted heart rate, and a peak blood   pressure of 178/92.    Patient completed 8 minutes Normal protocol reaching 98% predicted target   heart rate at a workload of 11.0 Mets achieved. No chest pain, ischemic   EKG changes or arrhythmias noted on this  moderate exercise workload. Low   risk treadmill EKG.  Normal stress echocardiogram. No regional wall motion abnormality on rest   or post exercise views of the left ventricle. No myocardial ischemia noted   at a workload of 11 METS.  Normal LV systolic function, LV EF 59-74%.  EKG 08/22/2019: Normal sinus rhythm with rate of 86 bpm, normal axis.  Poor R wave progression, probably normal variant.  No evidence of ischemia.   Probably normal EKG.    Assessment     ICD-10-CM   1. Tachycardia  R00.0   2. Atypical chest pain  R07.89 EKG 12-Lead  3. Elevated BP without diagnosis of hypertension  R03.0   4. Hypercholesteremia  E78.00  No orders of the defined types were placed in this encounter.   Medications Discontinued During This Encounter  Medication Reason  . clobetasol ointment (TEMOVATE) 0.05 % Error  . cyclobenzaprine (FLEXERIL) 10 MG tablet Error  . predniSONE (DELTASONE) 50 MG tablet Error    Recommendations:   Amy [name]  is a 44 y.o. patient was evaluated by different cardiology group for episodes of palpitation, underwent stress echocardiogram and also event monitor for 2 weeks.  She was also recently diagnosed with hyperlipidemia and started on statin therapy.  She has had episodes of palpitations, occasional episodes of shortness of breath in which she feels like she needs to take a deep breath and at times she has had chest tightness, not exertional.  As she has been very concerned about her symptoms, she requested a second opinion and has been referred to me by Dr. Veronia Beets.  I have reviewed all the data from prior evaluation.  Suspect her symptoms of palpitations are related to PACs and PVCs which correlated with event monitoring.  Also most of the symptoms are occurring in the evenings when she is resting again suggesting PACs and PVCs.  Also advised her if she has recurrence, to try PPI.  With regard to coronary artery disease risk, she is not diabetic and  a non-smoker but I do notice that her blood pressure was elevated today.  Blood pressure in Dr. Maryanna Shape office was also elevated at 90 mmHg diastolic.  I discussed with the patient that she needs to continue to monitor this and if blood pressure is >130/80 mmHg, we should certainly consider starting therapy for hypertension as she is only 44 years of age.  Blood pressure elevation could also be related to recent weight gain in the past 1 year of 10 pounds.  With regard to hyperlipidemia, she is on appropriate medical therapy and non-HDL cholesterol is at goal.  Also weight loss will help to improving the non-HDL and also LDL.  With regard to dyspnea, symptoms are very vague but not necessarily related to exertional activity.  Advised her watchful waiting.  No further evaluation from cardiac standpoint is indicated.  I have answered all her questions and reassured her.  She was pleased with the evaluation.  I thank Dr. Melford Aase for sending the patient to me. I spent 70 minutes in evaluation of patient records and evaluation of patient and coordination of care.   Adrian Prows, MD, [facility] 08/22/2019, 6:00 PM [facility] [place] Office: [phone]

## 2020-01-20 NOTE — Telephone Encounter (Signed)
From pt

## 2020-01-20 NOTE — Telephone Encounter (Signed)
Please arrange OV 2 weeks is okay

## 2020-01-23 ENCOUNTER — Telehealth: Payer: Self-pay | Admitting: Cardiology

## 2020-01-23 ENCOUNTER — Ambulatory Visit: Payer: 59 | Admitting: Cardiology

## 2020-01-23 ENCOUNTER — Other Ambulatory Visit: Payer: Self-pay

## 2020-01-23 DIAGNOSIS — E7800 Pure hypercholesterolemia, unspecified: Secondary | ICD-10-CM

## 2020-01-23 DIAGNOSIS — R002 Palpitations: Secondary | ICD-10-CM

## 2020-01-23 MED ORDER — PROPRANOLOL HCL 20 MG PO TABS: 20.0000 mg | ORAL_TABLET | Freq: Three times a day (TID) | ORAL | 3 refills | Status: AC | PRN

## 2020-01-23 MED ORDER — ROSUVASTATIN CALCIUM 10 MG PO TABS: 10.0000 mg | ORAL_TABLET | Freq: Every day | ORAL | Status: AC

## 2020-01-23 NOTE — Telephone Encounter (Signed)
Yes, she checked in about 11:25 for her 11:30 appointment.  She waited until almost 12 noon and said she couldn't wait any longer.  I apologized & that's when I told her you would call her.

## 2020-01-23 NOTE — Telephone Encounter (Signed)
Primary Physician/Referring:  Chesley Noon, MD  [name], female    DOB: 13-Mar-1976, 44 y.o.   MRN: [id number]  Virtual Visit  [name] has consented to conduct a Telehealth visit and understands insurance will be billed.   I connected with her 01/23/20 at 120 pm. I verified that I am speaking with the correct person using two identifiers.   I discussed the limitations of evaluation and management by telemedicine and the availability of in person appointments. [name] expressed understanding and agreed to proceed.    Chief Complaint  [name] presents with  . Palpitations   HPI:    Amy [name]  is a 44 y.o. [name] was evaluated by different cardiology group for episodes of palpitation, underwent stress echocardiogram and also event monitor for 2 weeks in Nov 2020.  Her symptoms clearly correlated with PACs and PVCs.  On her last office visit with me in February 2021, I had discussed extensively regarding her test and made a as needed visits.  Although no strong family history of premature coronary disease, with markedly elevated LDL, I recommended that she continue with Crestor.  She made an appointment to see me today, however the appointment was converted to a telehealth as [name] could not wait to see me and I was in the meeting.  She agreed to speak to me over the telephone.  States that for the past 2 weeks to 3 weeks, she has had recurrence of palpitations.  Again most of the episodes are occurring at rest while she is laying down in bed or just doing routine things around the house, feels her chest feels like flip-flopping, she also feels fullness in her neck and fullness in her chest that last a few seconds.  No other associated symptoms, no dizziness or syncope.  About 2 to 3 months ago she discontinued Crestor although her lipids had improved.  She thought that it may be causing symptoms but has not noticed any change in her symptoms in  spite of discontinuing Crestor.  On her last office visit she also had elevated blood pressure.  States that except rarely where the blood pressure is slightly elevated, she has been checking her blood pressure and it is well controlled and around 120/70 mmHg.  She does not smoke, there is no family history of premature coronary artery disease, no history of hypertension or diabetes.   Past Medical History:  Diagnosis Date  . Hyperlipidemia    no meds  . S/P cesarean section 01/15/2012  . URI (upper respiratory infection) 12/28/11   tx w/z-pack from 12/28/11 - 01/01/12   Past Surgical History:  Procedure Laterality Date  . CESAREAN SECTION  2008, 2010   x 2  . CESAREAN SECTION  01/13/2012   Procedure: CESAREAN SECTION;  Surgeon: Logan Bores, MD;  Location: [facility];  Service: Gynecology;  Laterality: N/A;  repeat   . TONSILLECTOMY     Family History  Problem Relation Age of Onset  . Cancer Mother        breast  . Hypertension Mother   . Hyperlipidemia Mother   . Breast cancer Mother 78  . Heart attack Father   . Hyperlipidemia Maternal Grandmother   . Hyperlipidemia Maternal Grandfather     Social History   Tobacco Use  . Smoking status: Never Smoker  . Smokeless tobacco: Never Used  Substance Use Topics  . Alcohol use: No    Comment: socially but none with  pregnancy   ROS  Review of Systems  Cardiovascular: Negative for chest pain, dyspnea on exertion and leg swelling. Positive for palpitations.  Gastrointestinal: Negative for melena. , GERD.   Objective   Physical exam not performed.  Prior examination is as below.  Vitals with BMI 08/22/2019 12/01/2015 06/14/2013  Height '5\' 2"'  '5\' 2"'  '5\' 2"'   Weight 142 lbs 14 oz 138 lbs 13 oz 148 lbs  BMI 26.13 70.7 86.7  Systolic 544 920 100  Diastolic 92 73 88  Pulse 83 88 90     Physical Exam  Cardiovascular: Normal rate, regular rhythm, normal heart sounds and intact distal pulses. Exam reveals no gallop.  No murmur  heard. No leg edema, no JVD.  Pulmonary/Chest: Effort normal and breath sounds normal.  Abdominal: Soft. Bowel sounds are normal.   Laboratory examination:   External labs  Comprehensive Metabolic PanelResulted: 01/05/1974 1:35 AM [facility] Component Name Value Ref Range Glucose 81 65 - 99 mg/dL BUN 10 6 - 24 mg/dL Creatinine, Serum 0.63 0.57 - 1 mg/dL eGFR If NonAfrican American 112 >59 mL/min/1.73 eGFR If African American 129 >59 mL/min/1.73 BUN/Creatinine Ratio 16 9 - 23  Sodium 140 134 - 144 mmol/L Potassium 5.0 3.5 - 5.2 mmol/L  Lipid Panel With LDL/HDL RatioResulted: 05/31/2019 7:36 AM [facility] Component Name Value Ref Range  Cholesterol, Total 233 (H) 100 - 199 mg/dL  Triglycerides 69 0 - 149 mg/dL  HDL 51 >39 mg/dL  VLDL Cholesterol Cal 12 5 - 40 mg/dL  LDL 170 (H) 0 - 99 mg/dL    CBC And DifferentialResulted: 04/19/2019 3:35 AM [facility] Component Name Value Ref Range WBC 6.7 3.4 - 10.8 x10E3/uL Hemoglobin 12.8 11.1 - 15.9 g/dL Hematocrit 39.9 34 - 46.6 % Platelet Count 352 150 - 450 x10E3/uL  TSH Reflex T4Free/T3 HormoneResulted: 04/19/2019 3:35 AM TSH 1.170 0.45 - 4.5 uIU/mL  Medications and allergies  No Known Allergies   Current Outpatient Medications  Medication Instructions  . Multiple Vitamin (MULTIVITAMIN) tablet 1 tablet, Oral, Daily  . Omega-3 Fatty Acids (FISH OIL) 1000 MG CAPS 2 capsules, Oral, Daily  . propranolol (INDERAL) 20 mg, Oral, 3 times daily PRN  . rizatriptan (MAXALT) 10 mg, Oral, As needed  . rosuvastatin (CRESTOR) 10 mg, Oral, Daily at bedtime  . triamcinolone ointment (KENALOG) 0.5 % 1 application, Topical, 2 times daily    Radiology:   XR Chest Pa And Lateral (07/30/2019 12:44 PM EST)  IMPRESSION: No active disease.    FINDINGS: Normal heart size. The lungs and pleural spaces are clear. Mild scoliosis.  Cardiac Studies:   Event monitor 15 days 06/13/2019:  Predominant rhythm is normal sinus rhythm.are  PACs and PVCs.    Stress echocardiogram 04/30/2019: Stress: Total exercise time was 8 minutes and 1 seconds. [name]   achieved 97% of the maximal age-predicted heart rate, and a peak blood   pressure of 178/92.    [name] completed 8 minutes Normal protocol reaching 98% predicted target   heart rate at a workload of 11.0 Mets achieved. No chest pain, ischemic   EKG changes or arrhythmias noted on this moderate exercise workload. Low   risk treadmill EKG.  Normal stress echocardiogram. No regional wall motion abnormality on rest   or post exercise views of the left ventricle. No myocardial ischemia noted   at a workload of 11 METS.  Normal LV systolic function, LV EF 88-32%.  EKG 08/22/2019: Normal sinus rhythm with rate of 86 bpm, normal axis.  Poor R wave progression, probably normal variant.  No evidence of ischemia.   Probably normal EKG.    Assessment     ICD-10-CM   1. Palpitations  R00.2 propranolol (INDERAL) 20 MG tablet  2. Hypercholesteremia  E78.00 rosuvastatin (CRESTOR) 10 MG tablet    Meds ordered this encounter  Medications  . rosuvastatin (CRESTOR) 10 MG tablet    Sig: Take 1 tablet (10 mg total) by mouth at bedtime.  . propranolol (INDERAL) 20 MG tablet    Sig: Take 1 tablet (20 mg total) by mouth 3 (three) times daily as needed.    Dispense:  60 tablet    Refill:  3    Medications Discontinued During This Encounter  Medication Reason  . rosuvastatin (CRESTOR) 10 MG tablet     Recommendations:   Amy [name]  is a 44 y.o.  [name] was evaluated by different cardiology group for episodes of palpitation, underwent stress echocardiogram and also event monitor for 2 weeks in Nov 2020.  Her symptoms clearly correlated with PACs and PVCs.  On her last office visit with me in February 2021, I had discussed extensively regarding her test and made a as needed visits.  She made an appointment to see me today, however the appointment was converted to a  telehealth as [name] could not wait to see me.  She agreed to speak to me over the telephone.  I again reviewed with her regarding all the test.  Advised her to try B12 and B6 supplements.  She is presently taking fish oil capsules already.  PPI did not make a change in her symptoms.  We discussed regarding use of beta-blockers for as needed use and propranolol 20 mg 3 times daily as needed prescribed.  Although no strong family history of premature coronary disease, with markedly elevated LDL, I recommended that she continue with Crestor.  She discontinued Crestor thinking that her symptoms of palpitations will go away and as there is no change in spite of discontinuing for several weeks, advised her to restart.  She had had good response to Crestor at a low dose.  She has Crestor at home and will resume.  Her blood pressure is well controlled as per [name], she has been monitoring this closely.  Previously she has noted to have elevated blood pressure in our office.  After discussion she was very pleased, she will make another appointment to see me if necessary.  In view of markedly elevated LDL, she was wondering if she needs any other testing, advised her that in the absence of any chest pain or dyspnea, only other recommendation would be to get a coronary calcium score, she will message me after discussing with her husband indeed if she wants to proceed with this.  I spent 28 min over the phone and additional 5 min for documentation.    Adrian Prows, MD, [facility] 01/23/2020, 1:56 PM Office: [phone]

## 2020-01-23 NOTE — Telephone Encounter (Signed)
When did she come? While I was talking to staff?

## 2020-01-28 DIAGNOSIS — R000 Tachycardia, unspecified: Secondary | ICD-10-CM

## 2020-01-28 DIAGNOSIS — R0789 Other chest pain: Secondary | ICD-10-CM

## 2020-01-28 DIAGNOSIS — R002 Palpitations: Secondary | ICD-10-CM

## 2020-01-29 NOTE — Telephone Encounter (Signed)
From patient.

## 2020-02-09 NOTE — Telephone Encounter (Signed)
Patient has been concerned about ongoing symptoms of chest pain, palpitations.  We will schedule her for a GXT, echocardiogram and also extended telemetry and see her back in the office.    ICD-10-CM   1. Palpitations  R00.2   2. Tachycardia  R00.0 LONG TERM MONITOR (3-14 DAYS)    PCV ECHOCARDIOGRAM COMPLETE  3. Atypical chest pain  R07.89 LONG TERM MONITOR (3-14 DAYS)    PCV ECHOCARDIOGRAM COMPLETE    PCV CARDIAC STRESS TEST     Yates Decamp, MD, [facility] 02/09/2020, 10:12 AM Office: [phone]

## 2020-02-10 NOTE — Telephone Encounter (Signed)
From Dr. Ganji

## 2020-02-11 NOTE — Telephone Encounter (Signed)
Amy [name], cancel GXT, keep echo and Monitor appointments and she is active on My Chart and give her appointment

## 2020-02-25 ENCOUNTER — Other Ambulatory Visit: Payer: Self-pay

## 2020-02-25 ENCOUNTER — Ambulatory Visit: Payer: 59

## 2020-02-25 DIAGNOSIS — R0789 Other chest pain: Secondary | ICD-10-CM

## 2020-02-25 DIAGNOSIS — R000 Tachycardia, unspecified: Secondary | ICD-10-CM

## 2020-03-05 ENCOUNTER — Other Ambulatory Visit: Payer: 59

## 2020-07-28 IMAGING — MG DIGITAL DIAGNOSTIC UNILATERAL LEFT MAMMOGRAM WITH TOMO AND CAD
4 series · 4 of 12 positions shown · non-contrast
Comparison: Previous exam(s).

CLINICAL DATA: 42-year-old female presenting for 18 month follow-up
of a probably benign left breast asymmetry without ultrasound
correlate.

EXAM:
DIGITAL DIAGNOSTIC UNILATERAL LEFT MAMMOGRAM WITH CAD AND TOMO

[L CC synth-2D]
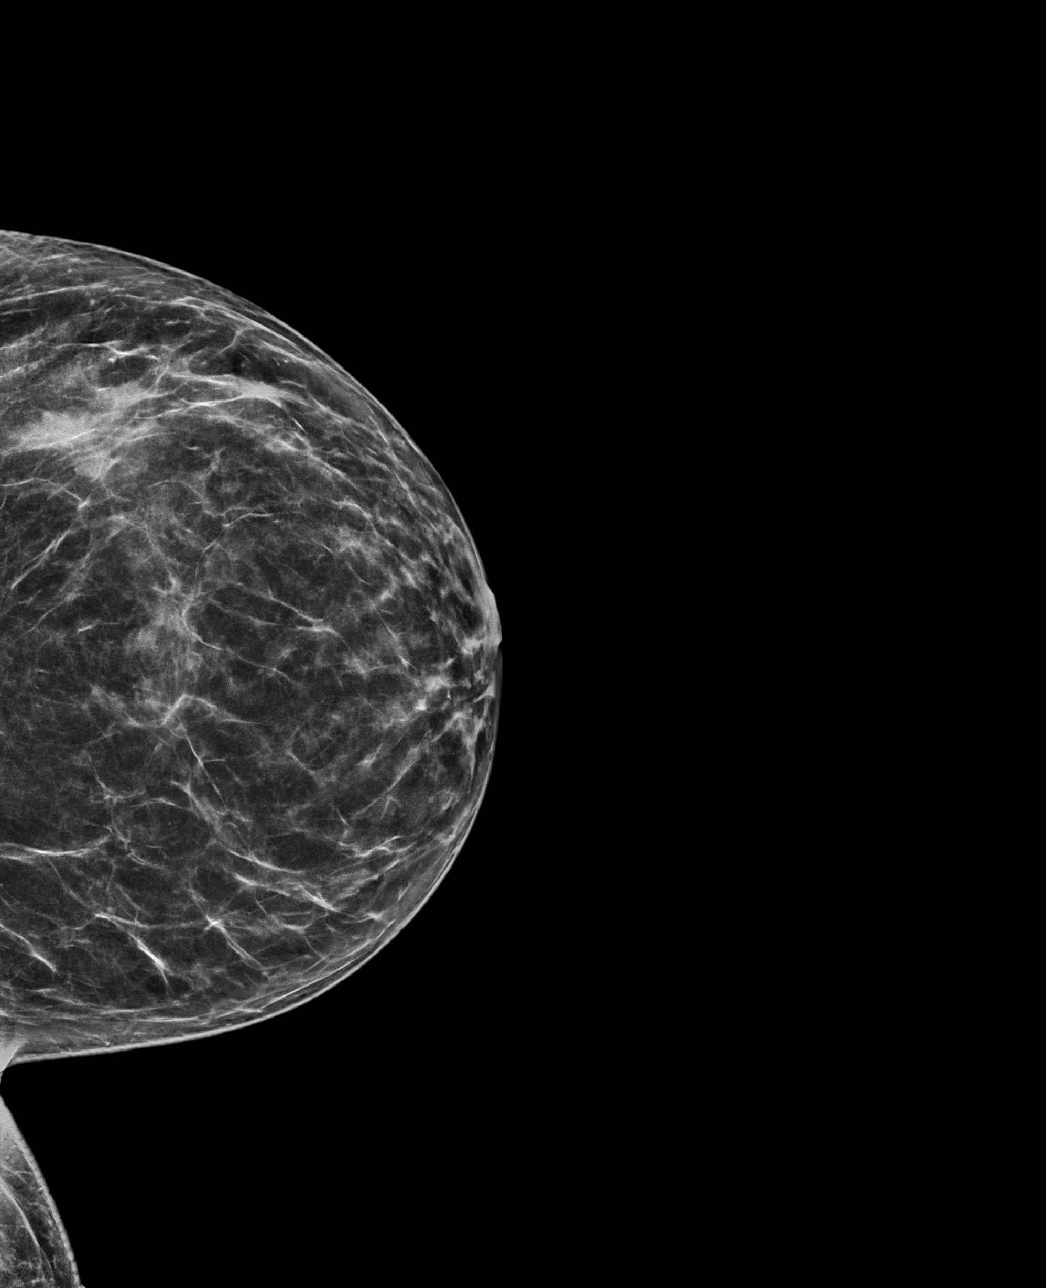

[L MLO synth-2D]
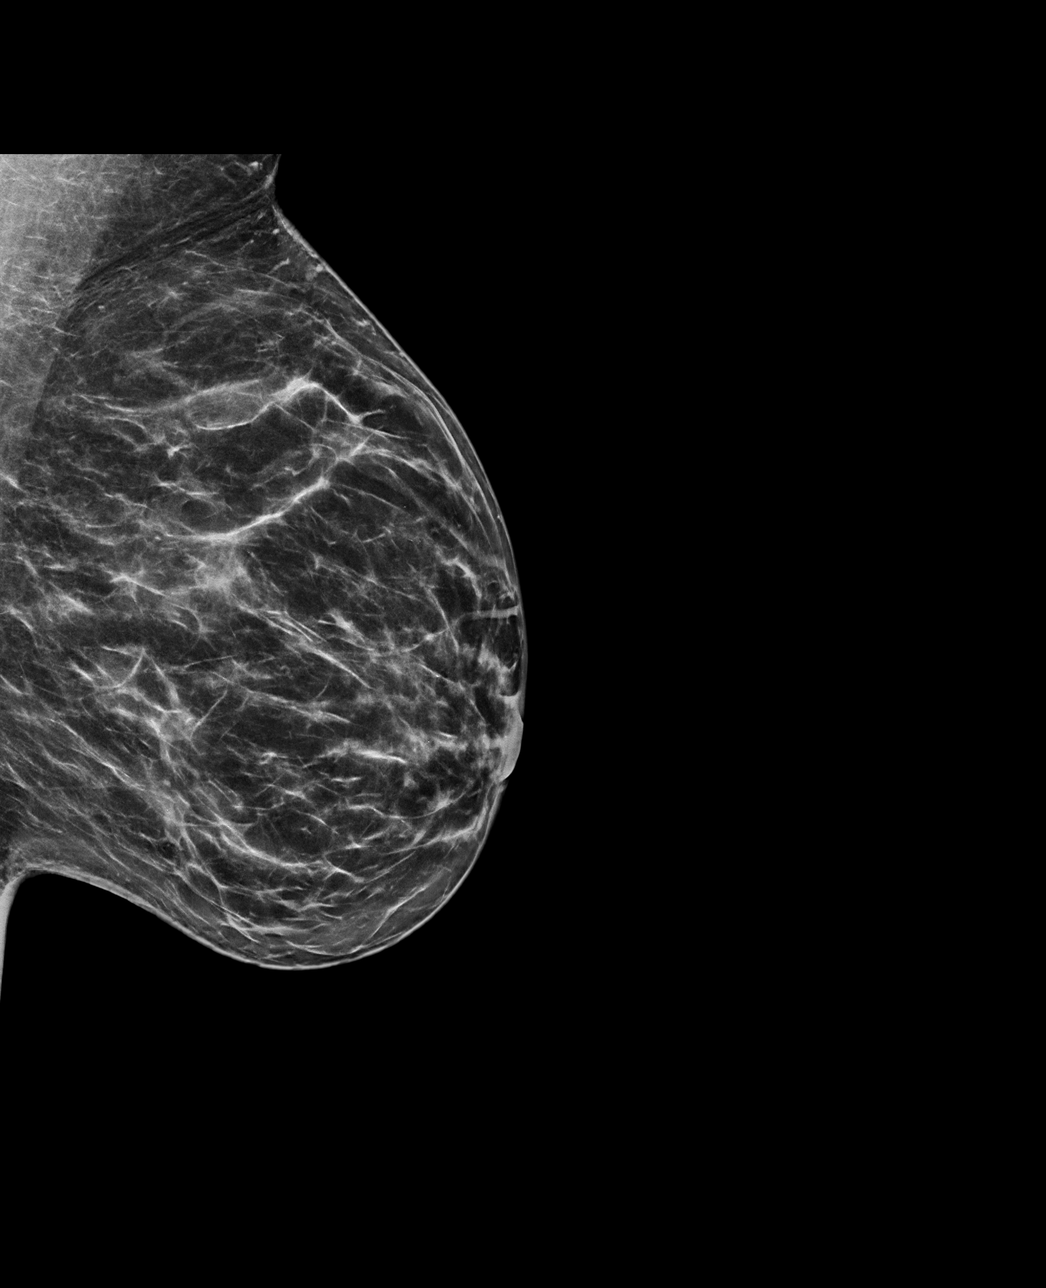

[L MLO tomo · tomo slice 39/77.0]
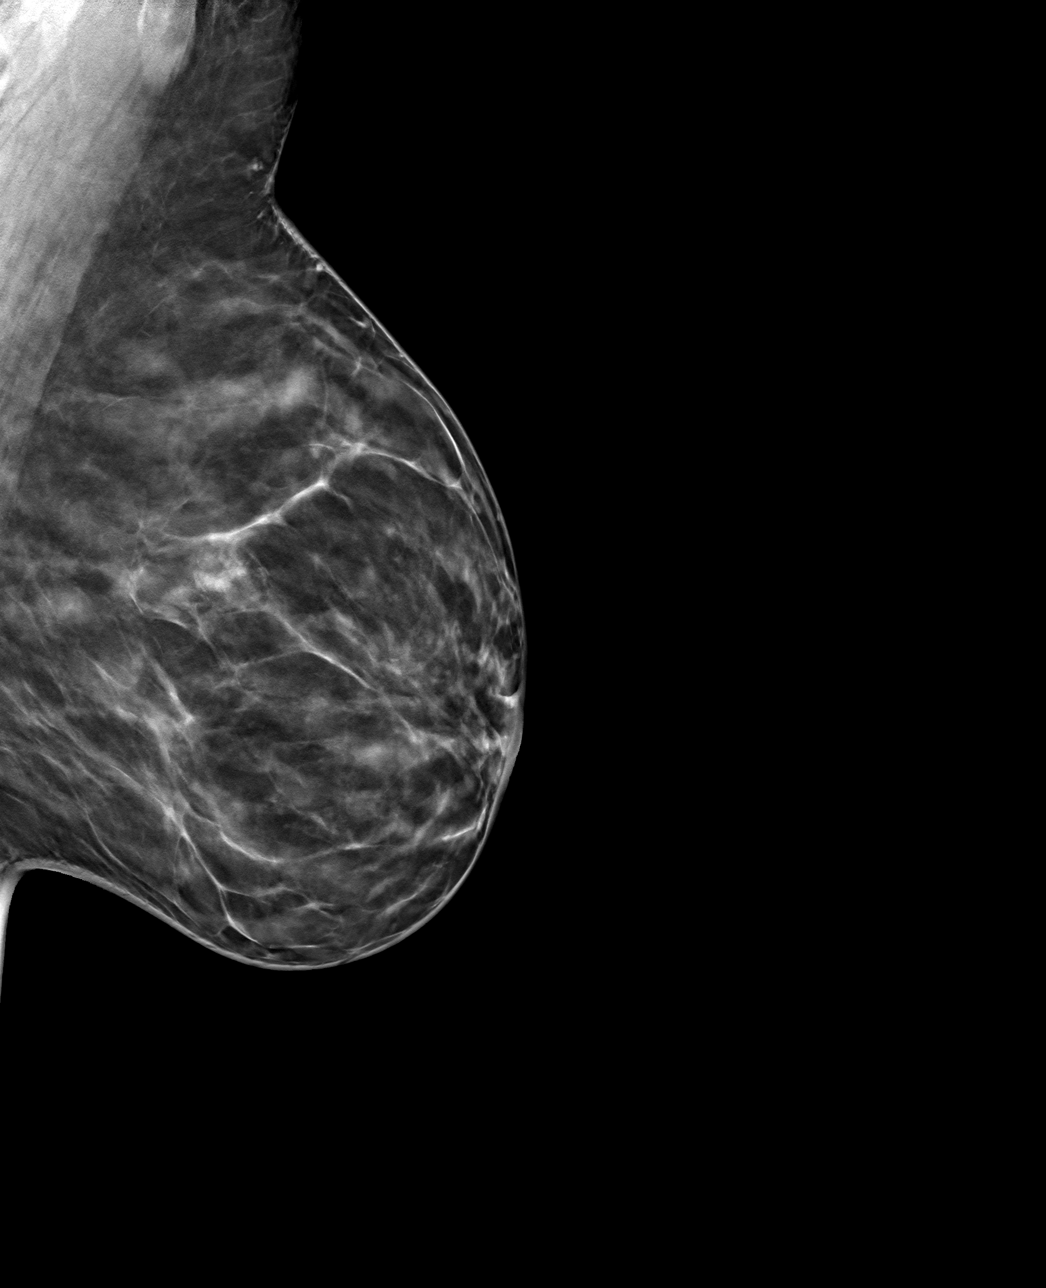

[L CC tomo · tomo slice 34/67.0]
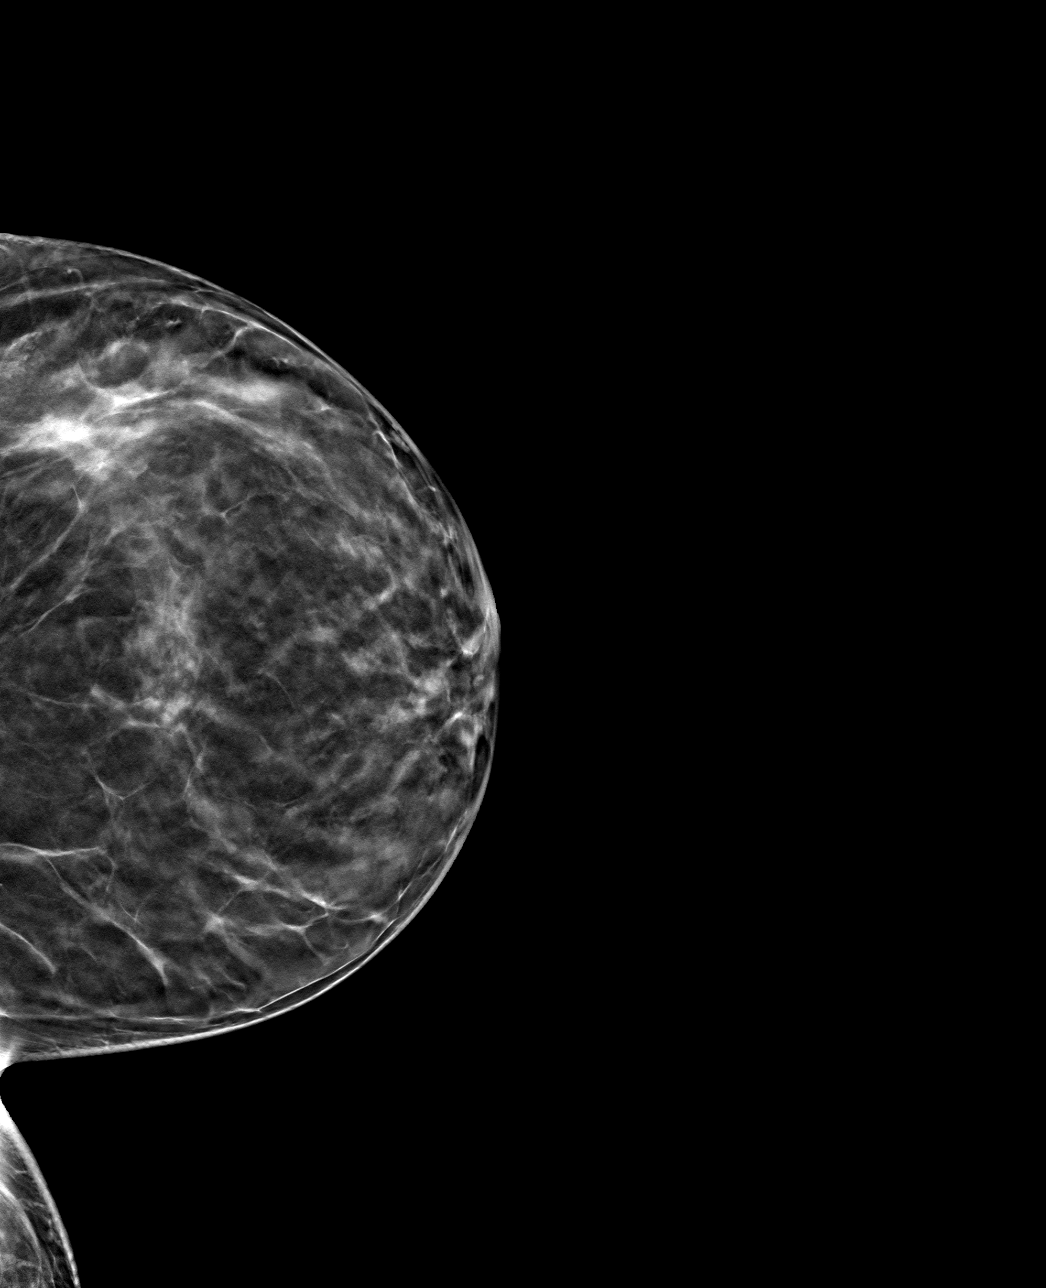

[4 of 12 positions shown; findings below may reference images not displayed]

ACR Breast Density Category c: The breast tissue is heterogeneously
dense, which may obscure small masses.
FINDINGS: Focal asymmetry in the superior central left breast at middle depth
appears mammographically unchanged. No new or suspicious findings
are identified.

Mammographic images were processed with CAD.
IMPRESSION: Stable, probably benign left breast asymmetry. Recommendation is for
a final ultrasound follow-up in 6 months to coincide with the
patient's annual bilateral mammogram.

RECOMMENDATION:
Bilateral diagnostic mammogram and possible left breast ultrasound
in 6 months.

I have discussed the findings and recommendations with the patient.
Results were also provided in writing at the conclusion of the
visit. If applicable, a reminder letter will be sent to the patient
regarding the next appointment.

BI-RADS CATEGORY  3: Probably benign.
